# Patient Record
Sex: Male | Born: 1950
Health system: Southern US, Community
[De-identification: ages and names within clinical notes are randomized; demographics above are authoritative.]

## PROBLEM LIST (undated history)

## (undated) DIAGNOSIS — T7840XA Allergy, unspecified, initial encounter: Secondary | ICD-10-CM

## (undated) DIAGNOSIS — I219 Acute myocardial infarction, unspecified: Secondary | ICD-10-CM

## (undated) DIAGNOSIS — E785 Hyperlipidemia, unspecified: Secondary | ICD-10-CM

## (undated) DIAGNOSIS — M1990 Unspecified osteoarthritis, unspecified site: Secondary | ICD-10-CM

## (undated) DIAGNOSIS — I10 Essential (primary) hypertension: Secondary | ICD-10-CM

## (undated) HISTORY — DX: Allergy, unspecified, initial encounter: T78.40XA

## (undated) HISTORY — DX: Hyperlipidemia, unspecified: E78.5

## (undated) HISTORY — DX: Unspecified osteoarthritis, unspecified site: M19.90

---

## 2004-12-10 ENCOUNTER — Emergency Department (HOSPITAL_COMMUNITY): Admission: EM | Admit: 2004-12-10 | Discharge: 2004-12-11 | Payer: Self-pay | Admitting: Emergency Medicine

## 2008-08-05 ENCOUNTER — Emergency Department (HOSPITAL_COMMUNITY): Admission: EM | Admit: 2008-08-05 | Discharge: 2008-08-05 | Payer: Self-pay | Admitting: Family Medicine

## 2008-08-09 ENCOUNTER — Emergency Department (HOSPITAL_COMMUNITY): Admission: EM | Admit: 2008-08-09 | Discharge: 2008-08-09 | Payer: Self-pay | Admitting: Family Medicine

## 2008-10-21 ENCOUNTER — Emergency Department (HOSPITAL_COMMUNITY): Admission: EM | Admit: 2008-10-21 | Discharge: 2008-10-21 | Payer: Self-pay | Admitting: Family Medicine

## 2011-08-01 ENCOUNTER — Emergency Department (HOSPITAL_COMMUNITY)
Admission: EM | Admit: 2011-08-01 | Discharge: 2011-08-01 | Disposition: A | Discharge status: Home / Self Care | Payer: Medicare Other | Attending: Emergency Medicine | Admitting: Emergency Medicine

## 2011-08-01 ENCOUNTER — Encounter (HOSPITAL_COMMUNITY): Payer: Self-pay | Admitting: Emergency Medicine

## 2011-08-01 DIAGNOSIS — J45909 Unspecified asthma, uncomplicated: Secondary | ICD-10-CM | POA: Insufficient documentation

## 2011-08-01 DIAGNOSIS — I10 Essential (primary) hypertension: Secondary | ICD-10-CM | POA: Insufficient documentation

## 2011-08-01 DIAGNOSIS — E119 Type 2 diabetes mellitus without complications: Secondary | ICD-10-CM | POA: Insufficient documentation

## 2011-08-01 DIAGNOSIS — R1084 Generalized abdominal pain: Secondary | ICD-10-CM | POA: Insufficient documentation

## 2011-08-01 DIAGNOSIS — R509 Fever, unspecified: Secondary | ICD-10-CM | POA: Insufficient documentation

## 2011-08-01 HISTORY — DX: Essential (primary) hypertension: I10

## 2011-08-01 LAB — URINALYSIS, ROUTINE W REFLEX MICROSCOPIC
Glucose, UA: NEGATIVE mg/dL
Hgb urine dipstick: NEGATIVE
Ketones, ur: NEGATIVE mg/dL
Leukocytes, UA: NEGATIVE
Nitrite: NEGATIVE
Protein, ur: NEGATIVE mg/dL
Specific Gravity, Urine: 1.027 (ref 1.005–1.030)
Urobilinogen, UA: 1 mg/dL (ref 0.0–1.0)
pH: 6 (ref 5.0–8.0)

## 2011-08-01 LAB — CBC
HCT: 43.7 % (ref 39.0–52.0)
Hemoglobin: 15.2 g/dL (ref 13.0–17.0)
MCH: 30.2 pg (ref 26.0–34.0)
MCHC: 34.8 g/dL (ref 30.0–36.0)
MCV: 86.9 fL (ref 78.0–100.0)
Platelets: 236 10*3/uL (ref 150–400)
RBC: 5.03 MIL/uL (ref 4.22–5.81)
RDW: 13 % (ref 11.5–15.5)
WBC: 12.8 10*3/uL — ABNORMAL HIGH (ref 4.0–10.5)

## 2011-08-01 LAB — DIFFERENTIAL
Basophils Absolute: 0 10*3/uL (ref 0.0–0.1)
Basophils Relative: 0 % (ref 0–1)
Eosinophils Absolute: 0.2 10*3/uL (ref 0.0–0.7)
Eosinophils Relative: 1 % (ref 0–5)
Lymphocytes Relative: 17 % (ref 12–46)
Lymphs Abs: 2.2 10*3/uL (ref 0.7–4.0)
Monocytes Absolute: 1.1 10*3/uL — ABNORMAL HIGH (ref 0.1–1.0)
Monocytes Relative: 8 % (ref 3–12)
Neutro Abs: 9.3 10*3/uL — ABNORMAL HIGH (ref 1.7–7.7)
Neutrophils Relative %: 73 % (ref 43–77)

## 2011-08-01 LAB — COMPREHENSIVE METABOLIC PANEL
ALT: 21 U/L (ref 0–53)
AST: 17 U/L (ref 0–37)
Albumin: 3.7 g/dL (ref 3.5–5.2)
Alkaline Phosphatase: 66 U/L (ref 39–117)
BUN: 14 mg/dL (ref 6–23)
CO2: 25 mEq/L (ref 19–32)
Calcium: 9.1 mg/dL (ref 8.4–10.5)
Chloride: 103 mEq/L (ref 96–112)
Creatinine, Ser: 0.95 mg/dL (ref 0.50–1.35)
GFR calc Af Amer: >90 mL/min (ref >90)
GFR calc non Af Amer: 88 mL/min — ABNORMAL LOW (ref >90)
Glucose, Bld: 61 mg/dL — ABNORMAL LOW (ref 70–99)
Potassium: 3.6 mEq/L (ref 3.5–5.1)
Sodium: 140 mEq/L (ref 135–145)
Total Bilirubin: 0.5 mg/dL (ref 0.3–1.2)
Total Protein: 6.4 g/dL (ref 6.0–8.3)

## 2011-08-01 NOTE — ED Provider Notes (Signed)
History     CSN: [id number]  Arrival date & time 08/01/11  2013   None     Chief Complaint  Patient presents with  . Abdominal Pain    (Consider location/radiation/quality/duration/timing/severity/associated sxs/prior treatment) HPI Comments: Patient has had 2 episodes of crampy, abdominal pain, lasting for 10-15 minutes.  Today's episode was associated with fever.  He reports no nausea, vomiting, constipation, or diarrhea, although he states his bowel movement.  Last night, was a loose within normal he does not have a history of abdominal pain.  Does not check his blood sugars on a regular basis.  Reports he has not had any coughing episodes or difficulty breathing.  Denies chest pain.  Patient is a 61 y.o. male presenting with abdominal pain. The history is provided by the patient.  Abdominal Pain The primary symptoms of the illness include abdominal pain and fever. The primary symptoms of the illness do not include shortness of breath, nausea, vomiting, diarrhea or dysuria. The current episode started yesterday.  Symptoms associated with the illness do not include constipation.    Past Medical History  Diagnosis Date  . Asthma   . Hypertension   . Diabetes mellitus     History reviewed. No pertinent past surgical history.  History reviewed. No pertinent family history.  History  Substance Use Topics  . Smoking status: Never Smoker   . Smokeless tobacco: Not on file  . Alcohol Use: No      Review of Systems  Constitutional: Positive for fever.  Respiratory: Negative for cough and shortness of breath.   Cardiovascular: Negative for chest pain and leg swelling.  Gastrointestinal: Positive for abdominal pain. Negative for nausea, vomiting, diarrhea and constipation.  Genitourinary: Negative for dysuria.  Skin: Negative for color change.  Neurological: Negative for dizziness, weakness and headaches.    Allergies  Penicillins; Percocet; and Sulfa antibiotics  Home  Medications   Current Outpatient Rx  Name Route Sig Dispense Refill  . ACETAMINOPHEN 500 MG PO TABS Oral Take 500 mg by mouth every 6 (six) hours as needed. For pain    . ALBUTEROL SULFATE HFA 108 (90 BASE) MCG/ACT IN AERS Inhalation Inhale 2 puffs into the lungs every 6 (six) hours as needed. For shortness of breath    . GLIPIZIDE ER 5 MG PO TB24 Oral Take 5 mg by mouth daily.    Marland Kitchen VERAPAMIL HCL ER 240 MG PO TBCR Oral Take 240 mg by mouth 2 (two) times daily.      BP 112/63  Pulse 63  Temp(Src) 98.3 F (36.8 C) (Oral)  Resp 18  SpO2 96%  Physical Exam  Constitutional: He is oriented to person, place, and time. He appears well-developed and well-nourished.  HENT:  Head: Normocephalic.  Neck: Normal range of motion.  Cardiovascular: Normal rate.   Pulmonary/Chest: Effort normal.  Abdominal: Soft. Bowel sounds are normal. He exhibits no distension. There is no tenderness.  Musculoskeletal: Normal range of motion.  Neurological: He is alert and oriented to person, place, and time.  Skin: Skin is warm and dry. No rash noted.    ED Course  Procedures (including critical care time)  Labs Reviewed  CBC - Abnormal; Notable for the following:    WBC 12.8 (*)    All other components within normal limits  DIFFERENTIAL - Abnormal; Notable for the following:    Neutro Abs 9.3 (*)    Monocytes Absolute 1.1 (*)    All other components within normal limits  COMPREHENSIVE  METABOLIC PANEL - Abnormal; Notable for the following:    Glucose, Bld 61 (*)    GFR calc non Af Amer 88 (*)    All other components within normal limits  URINALYSIS, ROUTINE W REFLEX MICROSCOPIC - Abnormal; Notable for the following:    Bilirubin Urine SMALL (*)    All other components within normal limits   No results found.   1. Abdominal pain, chronic, generalized     Discussed  with patient and his wife results.  The patient has been pain free since arrival in the emergency Department anything at this  point, it is okay for him to go home.  Monitoring his condition.  If he develops fever over 101.5 persistently nausea, vomiting, diarrhea, change in his symptoms at all.  He is to return for further evaluation  MDM  Exam is nonspecific, but do to his symptoms.  Age and underlying medical condition.  Will evaluate for gallbladder disease.  Signs of infection, UTI        Arman Filter, NP 08/01/11 [id number]

## 2011-08-01 NOTE — ED Notes (Signed)
Pt st's generalized abd pain started last pm after drinking a coke.  St's pain was a cramping type pain.  Denies any nausea or vomiting.  Pt st's he did have elevated temp earlier.

## 2011-08-01 NOTE — Discharge Instructions (Signed)
Abdominal Pain (Nonspecific) Your exam might not show the exact reason you have abdominal pain. Since there are many different causes of abdominal pain, another checkup and more tests may be needed. It is very important to follow up for lasting (persistent) or worsening symptoms. A possible cause of abdominal pain in any person who still has his or her appendix is acute appendicitis. Appendicitis is often hard to diagnose. Normal blood tests, urine tests, ultrasound, and CT scans do not completely rule out early appendicitis or other causes of abdominal pain. Sometimes, only the changes that happen over time will allow appendicitis and other causes of abdominal pain to be determined. Other potential problems that may require surgery may also take time to become more apparent. Because of this, it is important that you follow all of the instructions below. HOME CARE INSTRUCTIONS   Rest as much as possible.   Do not eat solid food until your pain is gone.   While adults or children have pain: A diet of water, weak decaffeinated tea, broth or bouillon, gelatin, oral rehydration solutions (ORS), frozen ice pops, or ice chips may be helpful.   When pain is gone in adults or children: Start a light diet (dry toast, crackers, applesauce, or white rice). Increase the diet slowly as long as it does not bother you. Eat no dairy products (including cheese and eggs) and no spicy, fatty, fried, or high-fiber foods.   Use no alcohol, caffeine, or cigarettes.   Take your regular medicines unless your caregiver told you not to.   Take any prescribed medicine as directed.   Only take over-the-counter or prescription medicines for pain, discomfort, or fever as directed by your caregiver. Do not give aspirin to children.  If your caregiver has given you a follow-up appointment, it is very important to keep that appointment. Not keeping the appointment could result in a permanent injury and/or lasting (chronic) pain  and/or disability. If there is any problem keeping the appointment, you must call to reschedule.  SEEK IMMEDIATE MEDICAL CARE IF:   Your pain is not gone in 24 hours.   Your pain becomes worse, changes location, or feels different.   You or your child has an oral temperature above 102 F (38.9 C), not controlled by medicine.   Your baby is older than 3 months with a rectal temperature of 102 F (38.9 C) or higher.   Your baby is 21 months old or younger with a rectal temperature of 100.4 F (38 C) or higher.   You have shaking chills.   You keep throwing up (vomiting) or cannot drink liquids.   There is blood in your vomit or you see blood in your bowel movements.   Your bowel movements become dark or black.   You have frequent bowel movements.   Your bowel movements stop (become blocked) or you cannot pass gas.   You have bloody, frequent, or painful urination.   You have yellow discoloration in the skin or whites of the eyes.   Your stomach becomes bloated or bigger.   You have dizziness or fainting.   You have chest or back pain.  MAKE SURE YOU:   Understand these instructions.   Will watch your condition.   Will get help right away if you are not doing well or get worse.  Document Released: 02/25/2005 Document Revised: 02/14/2011 Document Reviewed: 01/23/2009 [facility] Patient Information 2012 [place], [place]. As discussed.  If your symptoms become more persistent or change in any  way, you develop fever, nausea, vomiting, diarrhea.  Please return for further evaluation

## 2011-08-01 NOTE — ED Notes (Signed)
Patient with abdominal pain that started last night, no nausea or vomiting.

## 2011-08-02 NOTE — ED Provider Notes (Signed)
Medical screening examination/treatment/procedure(s) were performed by non-physician practitioner and as supervising physician I was immediately available for consultation/collaboration.  Flint Melter, MD 08/02/11 [phone]

## 2011-09-14 ENCOUNTER — Emergency Department (INDEPENDENT_AMBULATORY_CARE_PROVIDER_SITE_OTHER): Payer: Medicare Other

## 2011-09-14 ENCOUNTER — Encounter (HOSPITAL_COMMUNITY): Payer: Self-pay | Admitting: *Deleted

## 2011-09-14 ENCOUNTER — Emergency Department (INDEPENDENT_AMBULATORY_CARE_PROVIDER_SITE_OTHER)
Admission: EM | Admit: 2011-09-14 | Discharge: 2011-09-14 | Disposition: A | Discharge status: Home / Self Care | Payer: Medicare Other | Source: Home / Self Care | Attending: Emergency Medicine | Admitting: Emergency Medicine

## 2011-09-14 DIAGNOSIS — J45909 Unspecified asthma, uncomplicated: Secondary | ICD-10-CM

## 2011-09-14 DIAGNOSIS — J209 Acute bronchitis, unspecified: Secondary | ICD-10-CM

## 2011-09-14 HISTORY — DX: Acute myocardial infarction, unspecified: I21.9

## 2011-09-14 MED ORDER — BENZONATATE 200 MG PO CAPS: 200.0000 mg | ORAL_CAPSULE | Freq: Three times a day (TID) | ORAL | Status: AC | PRN

## 2011-09-14 MED ORDER — AZITHROMYCIN 250 MG PO TABS: ORAL_TABLET | ORAL | Status: AC

## 2011-09-14 MED ORDER — PREDNISONE 5 MG PO KIT: 1.0000 | PACK | Freq: Every day | ORAL | Status: DC

## 2011-09-14 NOTE — ED Provider Notes (Signed)
Chief Complaint  Patient presents with  . Cough  . Fever  . Nasal Congestion  . Wheezing  . Lymphadenopathy    History of Present Illness:   Anthony Swanson is a 61 year old male who has had a three-day history of nasal congestion with clear rhinorrhea, dry cough, temperature of up to 102, headache, and wheezing. He has a history of mild, intermittent asthma and takes albuterol as needed. He denies any headache, sinus pressure, ear pain, chest pain, shortness of breath, nausea, vomiting, or diarrhea.  Review of Systems:  Other than noted above, the patient denies any of the following symptoms. Systemic:  No fever, chills, sweats, fatigue, myalgias, headache, or anorexia. Eye:  No redness, pain or drainage. ENT:  No earache, ear congestion, nasal congestion, sneezing, rhinorrhea, sinus pressure, sinus pain, post nasal drip, or sore throat. Lungs:  No cough, sputum production, wheezing, shortness of breath, or chest pain. GI:  No abdominal pain, nausea, vomiting, or diarrhea. Skin:  No rash or itching.  PMFSH:  Past medical history, family history, social history, meds, and allergies were reviewed.  Physical Exam:   Vital signs:  BP 131/82  Pulse 64  Temp 98.7 F (37.1 C) (Oral)  Resp 16  SpO2 97% General:  Alert, in no distress. Eye:  No conjunctival injection or drainage. Lids were normal. ENT:  TMs and canals were normal, without erythema or inflammation.  Nasal mucosa was clear and uncongested, without drainage.  Mucous membranes were moist.  Pharynx was clear, without exudate or drainage.  There were no oral ulcerations or lesions. Neck:  Supple, no adenopathy, tenderness or mass. Lungs:  No respiratory distress.  Breath sounds are distant but quiet. He did have a few rales at the right base. No wheezes or rhonchi.  Heart:  Regular rhythm, without gallops, murmers or rubs. Skin:  Clear, warm, and dry, without rash or lesions.  Radiology:  Dg Chest 2 View  09/14/2011  *RADIOLOGY REPORT*   Clinical Data: 3-day history of cough and fever.  CHEST - 2 VIEW  Comparison: Two-view chest x-ray 08/09/2008.  Findings: Cardiac silhouette mildly enlarged but stable.  Thoracic aorta mildly tortuous and atherosclerotic, unchanged.  Hilar and mediastinal contours otherwise unremarkable.  Minimal linear scarring in the lingula.  Lungs otherwise clear.  No pleural effusions.  Pleural thickening laterally involving both sides of the chest likely due to subpleural fat.  Visualized bony thorax intact.  IMPRESSION: Minimal scarring in the lingula.  Stable cardiomegaly.  No acute cardiopulmonary disease.  Original Report Authenticated By: Arnell Sieving, M.D.    Assessment:  The primary encounter diagnosis was Acute bronchitis. A diagnosis of Asthma was also pertinent to this visit.  Plan:   1.  The following meds were prescribed:   New Prescriptions   AZITHROMYCIN (ZITHROMAX Z-PAK) 250 MG TABLET    Take as directed.   BENZONATATE (TESSALON) 200 MG CAPSULE    Take 1 capsule (200 mg total) by mouth 3 (three) times daily as needed for cough.   PREDNISONE 5 MG KIT    Take 1 kit (5 mg total) by mouth daily after breakfast. Prednisone 5 mg 6 day dosepack.  Take as directed.   2.  The patient was instructed in symptomatic care and handouts were given. 3.  The patient was told to return if becoming worse in any way, if no better in 3 or 4 days, and given some red flag symptoms that would indicate earlier return. He was instructed to use his albuterol  inhaler regularly.   Reuben Likes, MD 09/14/11 [phone]

## 2011-09-14 NOTE — ED Notes (Signed)
Pt with onset of cough /congestion/sneezing/fever Thursday - last used inhaler 3am this morning - fever 3am 101.5 -  Lungs diminished

## 2013-02-10 IMAGING — CR DG CHEST 2V
2 series · 2 of 2 positions shown · non-contrast
Comparison: Two-view chest x-ray 08/09/2008.

CLINICAL DATA: 3-day history of cough and fever.

CHEST - 2 VIEW

[view not recorded (1 of 2)]
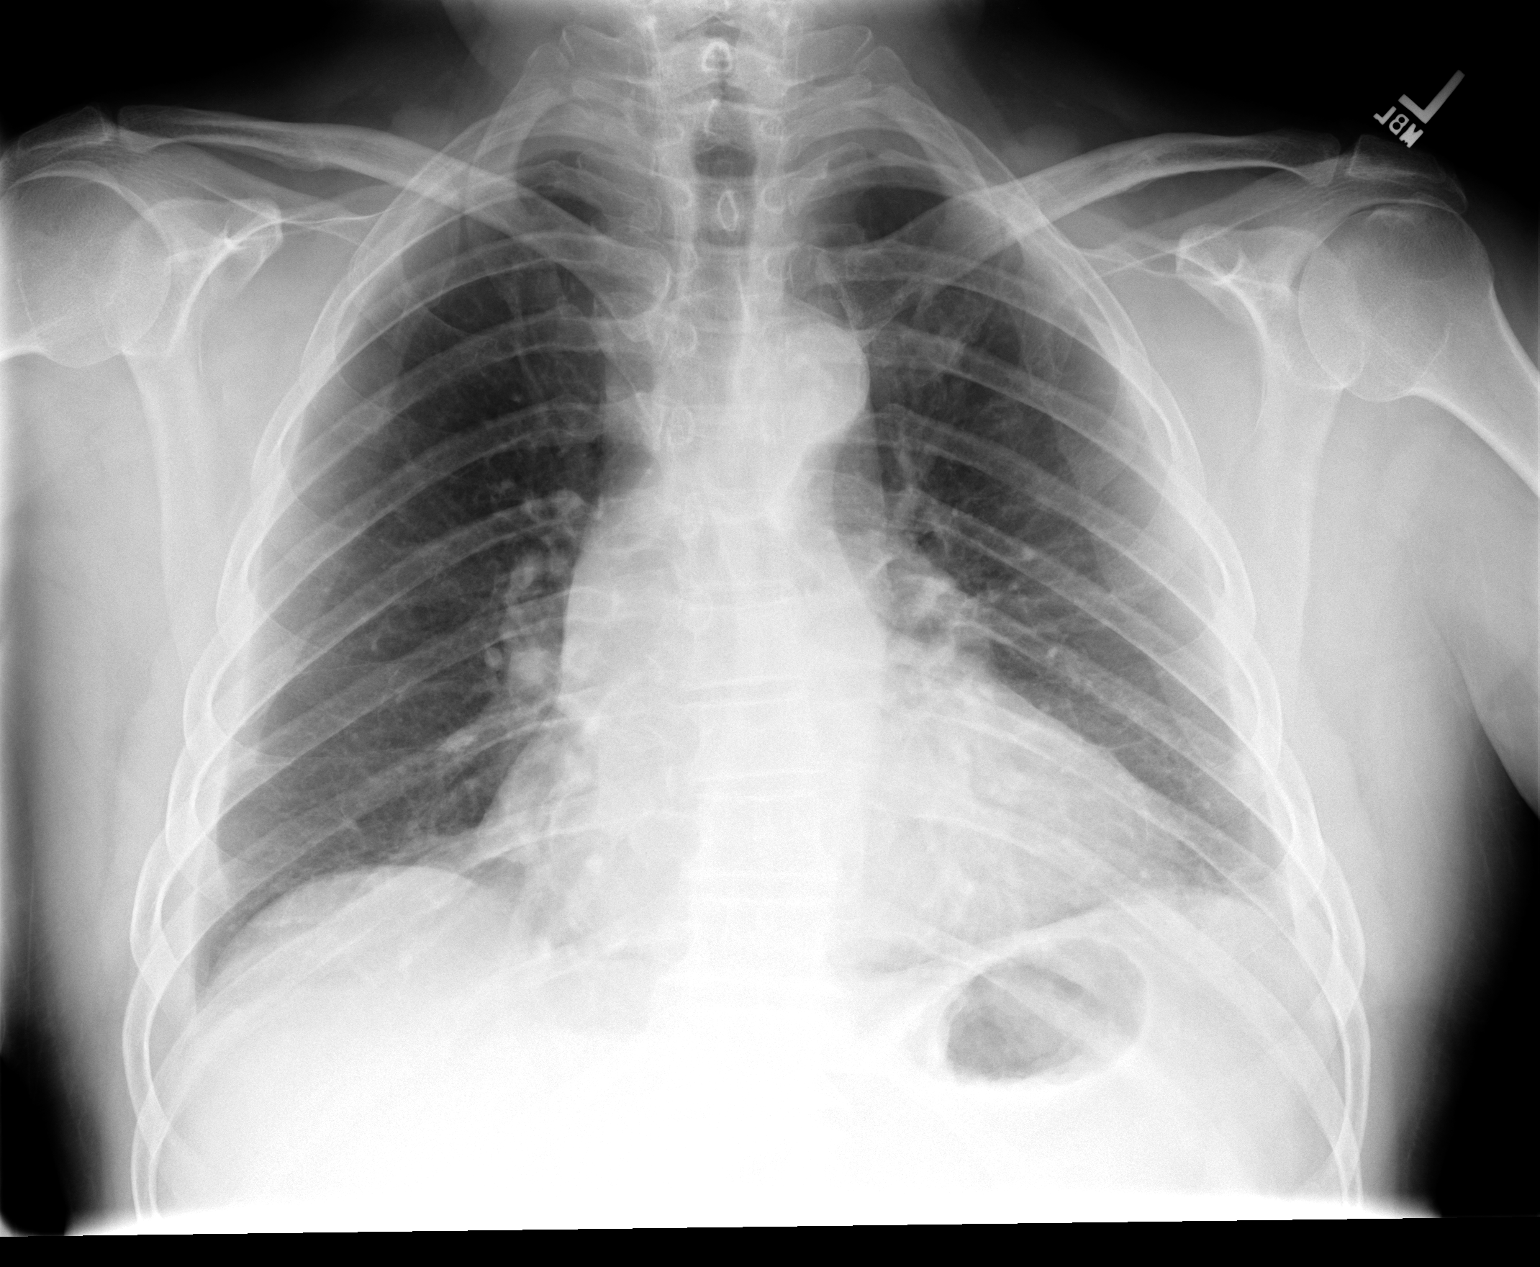

[view not recorded (2 of 2)]
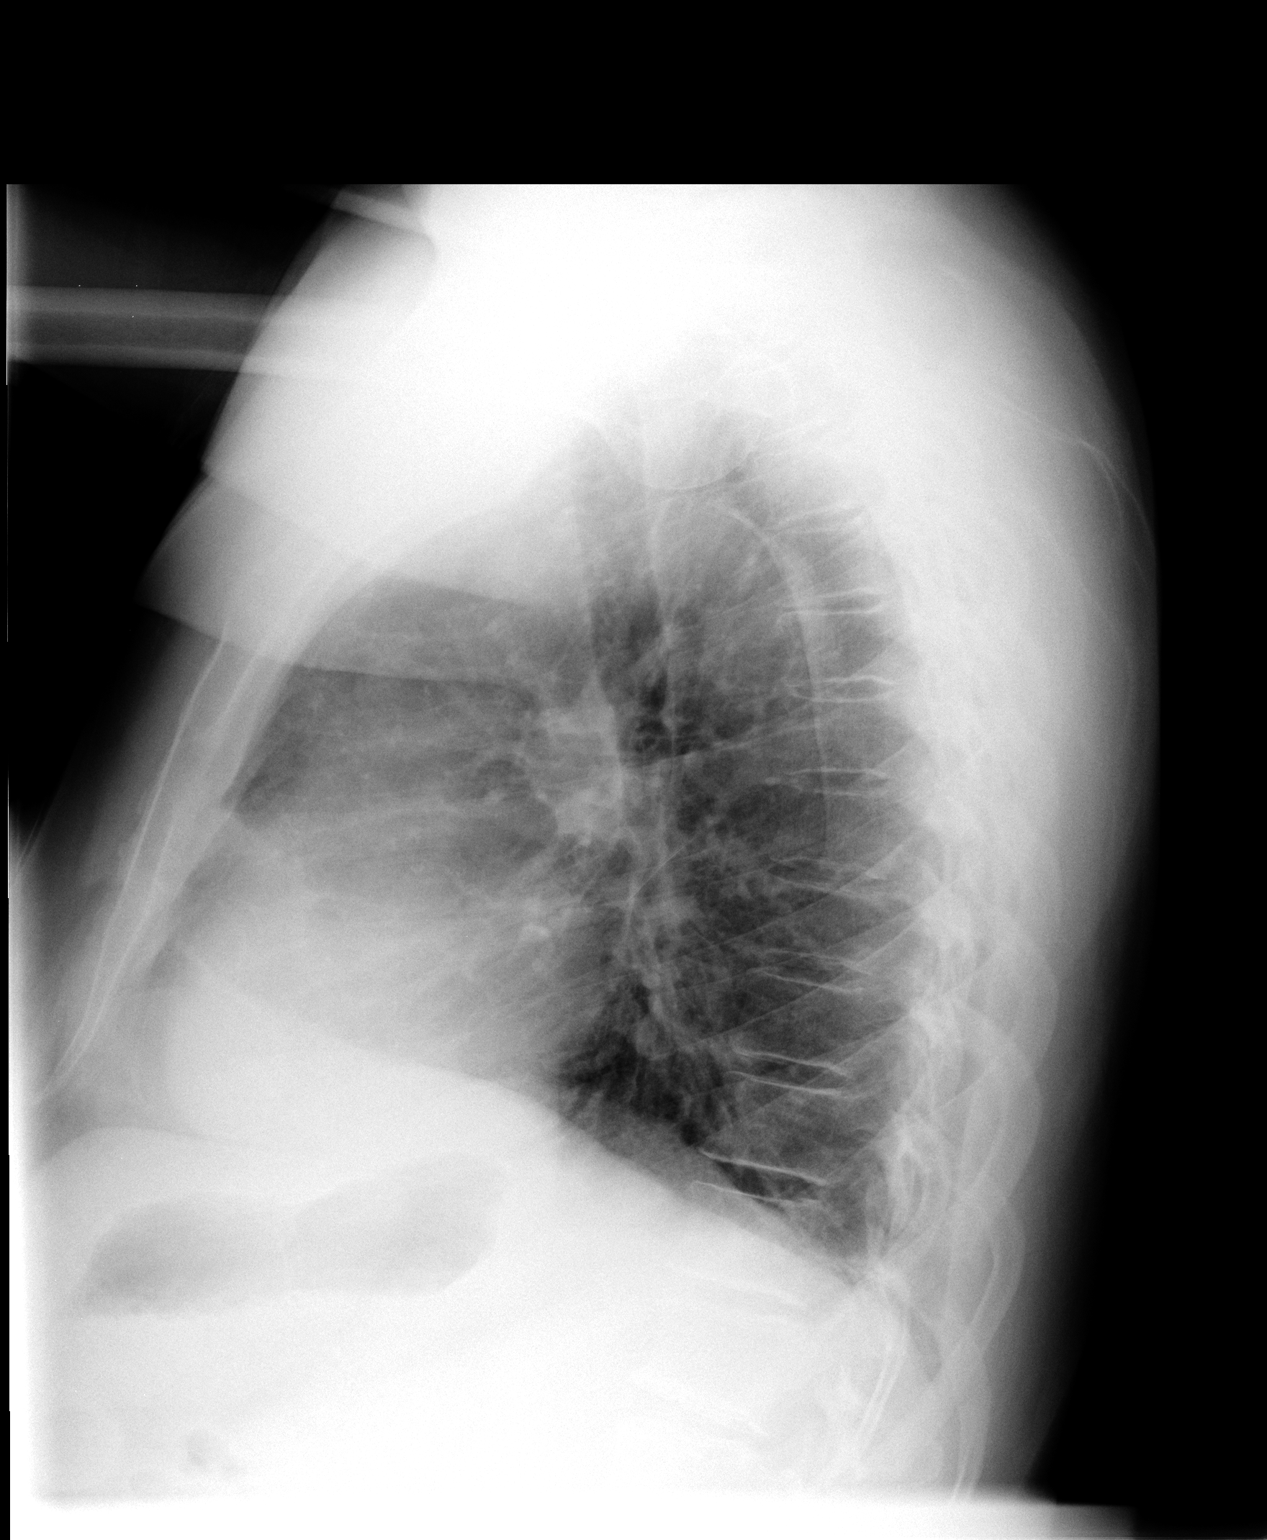

[2 of 2 positions shown; findings below may reference images not displayed]

FINDINGS: Cardiac silhouette mildly enlarged but stable.  Thoracic
aorta mildly tortuous and atherosclerotic, unchanged.  Hilar and
mediastinal contours otherwise unremarkable.  Minimal linear
scarring in the lingula.  Lungs otherwise clear.  No pleural
effusions.  Pleural thickening laterally involving both sides of
the chest likely due to subpleural fat.  Visualized bony thorax
intact.
IMPRESSION: Minimal scarring in the lingula.  Stable cardiomegaly.  No acute
cardiopulmonary disease.

## 2015-04-20 DIAGNOSIS — J4520 Mild intermittent asthma, uncomplicated: Secondary | ICD-10-CM | POA: Diagnosis not present

## 2015-06-02 DIAGNOSIS — Z0000 Encounter for general adult medical examination without abnormal findings: Secondary | ICD-10-CM | POA: Diagnosis not present

## 2015-06-02 DIAGNOSIS — I10 Essential (primary) hypertension: Secondary | ICD-10-CM | POA: Diagnosis not present

## 2015-06-02 DIAGNOSIS — E1165 Type 2 diabetes mellitus with hyperglycemia: Secondary | ICD-10-CM | POA: Diagnosis not present

## 2015-06-14 DIAGNOSIS — E1165 Type 2 diabetes mellitus with hyperglycemia: Secondary | ICD-10-CM | POA: Diagnosis not present

## 2015-06-14 DIAGNOSIS — I10 Essential (primary) hypertension: Secondary | ICD-10-CM | POA: Diagnosis not present

## 2015-09-18 DIAGNOSIS — E1165 Type 2 diabetes mellitus with hyperglycemia: Secondary | ICD-10-CM | POA: Diagnosis not present

## 2015-09-18 DIAGNOSIS — K210 Gastro-esophageal reflux disease with esophagitis: Secondary | ICD-10-CM | POA: Diagnosis not present

## 2015-09-18 DIAGNOSIS — I10 Essential (primary) hypertension: Secondary | ICD-10-CM | POA: Diagnosis not present

## 2015-12-20 DIAGNOSIS — J4520 Mild intermittent asthma, uncomplicated: Secondary | ICD-10-CM | POA: Diagnosis not present

## 2016-02-05 DIAGNOSIS — E1165 Type 2 diabetes mellitus with hyperglycemia: Secondary | ICD-10-CM | POA: Diagnosis not present

## 2016-02-05 DIAGNOSIS — K210 Gastro-esophageal reflux disease with esophagitis: Secondary | ICD-10-CM | POA: Diagnosis not present

## 2016-02-05 DIAGNOSIS — I10 Essential (primary) hypertension: Secondary | ICD-10-CM | POA: Diagnosis not present

## 2016-02-05 DIAGNOSIS — Z1389 Encounter for screening for other disorder: Secondary | ICD-10-CM | POA: Diagnosis not present

## 2016-02-05 DIAGNOSIS — Z0000 Encounter for general adult medical examination without abnormal findings: Secondary | ICD-10-CM | POA: Diagnosis not present

## 2016-04-18 DIAGNOSIS — E1165 Type 2 diabetes mellitus with hyperglycemia: Secondary | ICD-10-CM | POA: Diagnosis not present

## 2016-04-18 DIAGNOSIS — I10 Essential (primary) hypertension: Secondary | ICD-10-CM | POA: Diagnosis not present

## 2016-04-18 DIAGNOSIS — K210 Gastro-esophageal reflux disease with esophagitis: Secondary | ICD-10-CM | POA: Diagnosis not present

## 2016-04-18 DIAGNOSIS — Z0000 Encounter for general adult medical examination without abnormal findings: Secondary | ICD-10-CM | POA: Diagnosis not present

## 2016-07-18 DIAGNOSIS — I10 Essential (primary) hypertension: Secondary | ICD-10-CM | POA: Diagnosis not present

## 2016-07-18 DIAGNOSIS — K210 Gastro-esophageal reflux disease with esophagitis: Secondary | ICD-10-CM | POA: Diagnosis not present

## 2016-07-18 DIAGNOSIS — E1165 Type 2 diabetes mellitus with hyperglycemia: Secondary | ICD-10-CM | POA: Diagnosis not present

## 2016-10-25 DIAGNOSIS — K210 Gastro-esophageal reflux disease with esophagitis: Secondary | ICD-10-CM | POA: Diagnosis not present

## 2016-10-25 DIAGNOSIS — I10 Essential (primary) hypertension: Secondary | ICD-10-CM | POA: Diagnosis not present

## 2016-10-25 DIAGNOSIS — E1165 Type 2 diabetes mellitus with hyperglycemia: Secondary | ICD-10-CM | POA: Diagnosis not present

## 2016-10-30 DIAGNOSIS — E1165 Type 2 diabetes mellitus with hyperglycemia: Secondary | ICD-10-CM | POA: Diagnosis not present

## 2016-10-30 DIAGNOSIS — I10 Essential (primary) hypertension: Secondary | ICD-10-CM | POA: Diagnosis not present

## 2017-01-28 DIAGNOSIS — I10 Essential (primary) hypertension: Secondary | ICD-10-CM | POA: Diagnosis not present

## 2017-01-28 DIAGNOSIS — E1165 Type 2 diabetes mellitus with hyperglycemia: Secondary | ICD-10-CM | POA: Diagnosis not present

## 2017-01-28 DIAGNOSIS — K210 Gastro-esophageal reflux disease with esophagitis: Secondary | ICD-10-CM | POA: Diagnosis not present

## 2017-05-22 DIAGNOSIS — K210 Gastro-esophageal reflux disease with esophagitis: Secondary | ICD-10-CM | POA: Diagnosis not present

## 2017-05-22 DIAGNOSIS — E1165 Type 2 diabetes mellitus with hyperglycemia: Secondary | ICD-10-CM | POA: Diagnosis not present

## 2017-05-22 DIAGNOSIS — I10 Essential (primary) hypertension: Secondary | ICD-10-CM | POA: Diagnosis not present

## 2017-05-22 DIAGNOSIS — Z1389 Encounter for screening for other disorder: Secondary | ICD-10-CM | POA: Diagnosis not present

## 2017-05-22 DIAGNOSIS — Z0000 Encounter for general adult medical examination without abnormal findings: Secondary | ICD-10-CM | POA: Diagnosis not present

## 2017-06-19 DIAGNOSIS — J4520 Mild intermittent asthma, uncomplicated: Secondary | ICD-10-CM | POA: Diagnosis not present

## 2017-07-19 DIAGNOSIS — J4520 Mild intermittent asthma, uncomplicated: Secondary | ICD-10-CM | POA: Diagnosis not present

## 2017-08-19 DIAGNOSIS — J4520 Mild intermittent asthma, uncomplicated: Secondary | ICD-10-CM | POA: Diagnosis not present

## 2017-08-21 DIAGNOSIS — K210 Gastro-esophageal reflux disease with esophagitis: Secondary | ICD-10-CM | POA: Diagnosis not present

## 2017-08-21 DIAGNOSIS — E1165 Type 2 diabetes mellitus with hyperglycemia: Secondary | ICD-10-CM | POA: Diagnosis not present

## 2017-08-21 DIAGNOSIS — I10 Essential (primary) hypertension: Secondary | ICD-10-CM | POA: Diagnosis not present

## 2017-09-18 DIAGNOSIS — J4520 Mild intermittent asthma, uncomplicated: Secondary | ICD-10-CM | POA: Diagnosis not present

## 2017-10-05 DIAGNOSIS — K3580 Unspecified acute appendicitis: Secondary | ICD-10-CM | POA: Diagnosis not present

## 2017-10-05 DIAGNOSIS — R109 Unspecified abdominal pain: Secondary | ICD-10-CM | POA: Diagnosis not present

## 2017-10-05 DIAGNOSIS — Z880 Allergy status to penicillin: Secondary | ICD-10-CM | POA: Diagnosis not present

## 2017-10-05 DIAGNOSIS — Z91030 Bee allergy status: Secondary | ICD-10-CM | POA: Diagnosis not present

## 2017-10-05 DIAGNOSIS — R1031 Right lower quadrant pain: Secondary | ICD-10-CM | POA: Diagnosis not present

## 2017-10-05 DIAGNOSIS — Z882 Allergy status to sulfonamides status: Secondary | ICD-10-CM | POA: Diagnosis not present

## 2017-10-05 DIAGNOSIS — Z7984 Long term (current) use of oral hypoglycemic drugs: Secondary | ICD-10-CM | POA: Diagnosis not present

## 2017-10-05 DIAGNOSIS — Z885 Allergy status to narcotic agent status: Secondary | ICD-10-CM | POA: Diagnosis not present

## 2017-10-05 DIAGNOSIS — E119 Type 2 diabetes mellitus without complications: Secondary | ICD-10-CM | POA: Diagnosis not present

## 2017-10-05 DIAGNOSIS — Z79899 Other long term (current) drug therapy: Secondary | ICD-10-CM | POA: Diagnosis not present

## 2017-10-05 DIAGNOSIS — I10 Essential (primary) hypertension: Secondary | ICD-10-CM | POA: Diagnosis not present

## 2017-10-06 DIAGNOSIS — Z880 Allergy status to penicillin: Secondary | ICD-10-CM | POA: Diagnosis not present

## 2017-10-06 DIAGNOSIS — Z7984 Long term (current) use of oral hypoglycemic drugs: Secondary | ICD-10-CM | POA: Diagnosis not present

## 2017-10-06 DIAGNOSIS — I10 Essential (primary) hypertension: Secondary | ICD-10-CM | POA: Diagnosis not present

## 2017-10-06 DIAGNOSIS — Z885 Allergy status to narcotic agent status: Secondary | ICD-10-CM | POA: Diagnosis not present

## 2017-10-06 DIAGNOSIS — K3580 Unspecified acute appendicitis: Secondary | ICD-10-CM | POA: Diagnosis not present

## 2017-10-06 DIAGNOSIS — Z79899 Other long term (current) drug therapy: Secondary | ICD-10-CM | POA: Diagnosis not present

## 2017-10-06 DIAGNOSIS — K4090 Unilateral inguinal hernia, without obstruction or gangrene, not specified as recurrent: Secondary | ICD-10-CM | POA: Diagnosis not present

## 2017-10-06 DIAGNOSIS — Z91030 Bee allergy status: Secondary | ICD-10-CM | POA: Diagnosis not present

## 2017-10-06 DIAGNOSIS — R1031 Right lower quadrant pain: Secondary | ICD-10-CM | POA: Diagnosis not present

## 2017-10-06 DIAGNOSIS — E119 Type 2 diabetes mellitus without complications: Secondary | ICD-10-CM | POA: Diagnosis not present

## 2017-10-06 DIAGNOSIS — Z882 Allergy status to sulfonamides status: Secondary | ICD-10-CM | POA: Diagnosis not present

## 2017-10-07 DIAGNOSIS — E119 Type 2 diabetes mellitus without complications: Secondary | ICD-10-CM | POA: Diagnosis not present

## 2017-10-07 DIAGNOSIS — K3580 Unspecified acute appendicitis: Secondary | ICD-10-CM | POA: Diagnosis not present

## 2017-10-07 DIAGNOSIS — I10 Essential (primary) hypertension: Secondary | ICD-10-CM | POA: Diagnosis not present

## 2017-10-07 DIAGNOSIS — Z880 Allergy status to penicillin: Secondary | ICD-10-CM | POA: Diagnosis not present

## 2017-10-07 DIAGNOSIS — Z79899 Other long term (current) drug therapy: Secondary | ICD-10-CM | POA: Diagnosis not present

## 2017-10-07 DIAGNOSIS — Z7984 Long term (current) use of oral hypoglycemic drugs: Secondary | ICD-10-CM | POA: Diagnosis not present

## 2017-10-07 DIAGNOSIS — Z885 Allergy status to narcotic agent status: Secondary | ICD-10-CM | POA: Diagnosis not present

## 2017-10-07 DIAGNOSIS — Z91030 Bee allergy status: Secondary | ICD-10-CM | POA: Diagnosis not present

## 2017-10-07 DIAGNOSIS — Z882 Allergy status to sulfonamides status: Secondary | ICD-10-CM | POA: Diagnosis not present

## 2017-10-08 DIAGNOSIS — K3580 Unspecified acute appendicitis: Secondary | ICD-10-CM | POA: Diagnosis not present

## 2017-10-08 DIAGNOSIS — Z885 Allergy status to narcotic agent status: Secondary | ICD-10-CM | POA: Diagnosis not present

## 2017-10-08 DIAGNOSIS — Z880 Allergy status to penicillin: Secondary | ICD-10-CM | POA: Diagnosis not present

## 2017-10-08 DIAGNOSIS — Z7984 Long term (current) use of oral hypoglycemic drugs: Secondary | ICD-10-CM | POA: Diagnosis not present

## 2017-10-08 DIAGNOSIS — Z79899 Other long term (current) drug therapy: Secondary | ICD-10-CM | POA: Diagnosis not present

## 2017-10-08 DIAGNOSIS — Z91030 Bee allergy status: Secondary | ICD-10-CM | POA: Diagnosis not present

## 2017-10-08 DIAGNOSIS — Z882 Allergy status to sulfonamides status: Secondary | ICD-10-CM | POA: Diagnosis not present

## 2017-10-08 DIAGNOSIS — E119 Type 2 diabetes mellitus without complications: Secondary | ICD-10-CM | POA: Diagnosis not present

## 2017-10-08 DIAGNOSIS — I10 Essential (primary) hypertension: Secondary | ICD-10-CM | POA: Diagnosis not present

## 2017-10-13 DIAGNOSIS — E1165 Type 2 diabetes mellitus with hyperglycemia: Secondary | ICD-10-CM | POA: Diagnosis not present

## 2017-10-13 DIAGNOSIS — I10 Essential (primary) hypertension: Secondary | ICD-10-CM | POA: Diagnosis not present

## 2017-10-13 DIAGNOSIS — K210 Gastro-esophageal reflux disease with esophagitis: Secondary | ICD-10-CM | POA: Diagnosis not present

## 2017-10-19 DIAGNOSIS — J4520 Mild intermittent asthma, uncomplicated: Secondary | ICD-10-CM | POA: Diagnosis not present

## 2017-11-19 DIAGNOSIS — J4520 Mild intermittent asthma, uncomplicated: Secondary | ICD-10-CM | POA: Diagnosis not present

## 2017-11-27 DIAGNOSIS — I10 Essential (primary) hypertension: Secondary | ICD-10-CM | POA: Diagnosis not present

## 2017-11-27 DIAGNOSIS — Z0000 Encounter for general adult medical examination without abnormal findings: Secondary | ICD-10-CM | POA: Diagnosis not present

## 2017-11-27 DIAGNOSIS — K210 Gastro-esophageal reflux disease with esophagitis: Secondary | ICD-10-CM | POA: Diagnosis not present

## 2017-11-27 DIAGNOSIS — E1165 Type 2 diabetes mellitus with hyperglycemia: Secondary | ICD-10-CM | POA: Diagnosis not present

## 2018-01-19 DIAGNOSIS — J4520 Mild intermittent asthma, uncomplicated: Secondary | ICD-10-CM | POA: Diagnosis not present

## 2018-02-18 DIAGNOSIS — J4520 Mild intermittent asthma, uncomplicated: Secondary | ICD-10-CM | POA: Diagnosis not present

## 2018-03-02 DIAGNOSIS — K210 Gastro-esophageal reflux disease with esophagitis: Secondary | ICD-10-CM | POA: Diagnosis not present

## 2018-03-02 DIAGNOSIS — E1165 Type 2 diabetes mellitus with hyperglycemia: Secondary | ICD-10-CM | POA: Diagnosis not present

## 2018-03-02 DIAGNOSIS — I10 Essential (primary) hypertension: Secondary | ICD-10-CM | POA: Diagnosis not present

## 2018-03-10 DIAGNOSIS — E1141 Type 2 diabetes mellitus with diabetic mononeuropathy: Secondary | ICD-10-CM | POA: Diagnosis not present

## 2018-03-11 HISTORY — PX: APPENDECTOMY: SHX54

## 2018-03-21 DIAGNOSIS — J4520 Mild intermittent asthma, uncomplicated: Secondary | ICD-10-CM | POA: Diagnosis not present

## 2018-04-21 DIAGNOSIS — J4520 Mild intermittent asthma, uncomplicated: Secondary | ICD-10-CM | POA: Diagnosis not present

## 2018-05-20 DIAGNOSIS — J4520 Mild intermittent asthma, uncomplicated: Secondary | ICD-10-CM | POA: Diagnosis not present

## 2018-06-04 DIAGNOSIS — Z1389 Encounter for screening for other disorder: Secondary | ICD-10-CM | POA: Diagnosis not present

## 2018-06-04 DIAGNOSIS — K210 Gastro-esophageal reflux disease with esophagitis: Secondary | ICD-10-CM | POA: Diagnosis not present

## 2018-06-04 DIAGNOSIS — I10 Essential (primary) hypertension: Secondary | ICD-10-CM | POA: Diagnosis not present

## 2018-06-04 DIAGNOSIS — Z0000 Encounter for general adult medical examination without abnormal findings: Secondary | ICD-10-CM | POA: Diagnosis not present

## 2018-06-04 DIAGNOSIS — E1141 Type 2 diabetes mellitus with diabetic mononeuropathy: Secondary | ICD-10-CM | POA: Diagnosis not present

## 2018-10-08 DIAGNOSIS — K210 Gastro-esophageal reflux disease with esophagitis: Secondary | ICD-10-CM | POA: Diagnosis not present

## 2018-10-08 DIAGNOSIS — I10 Essential (primary) hypertension: Secondary | ICD-10-CM | POA: Diagnosis not present

## 2018-10-08 DIAGNOSIS — E785 Hyperlipidemia, unspecified: Secondary | ICD-10-CM | POA: Diagnosis not present

## 2018-10-08 DIAGNOSIS — E1141 Type 2 diabetes mellitus with diabetic mononeuropathy: Secondary | ICD-10-CM | POA: Diagnosis not present

## 2019-01-19 DIAGNOSIS — I10 Essential (primary) hypertension: Secondary | ICD-10-CM | POA: Diagnosis not present

## 2019-01-19 DIAGNOSIS — Z6831 Body mass index (BMI) 31.0-31.9, adult: Secondary | ICD-10-CM | POA: Diagnosis not present

## 2019-01-19 DIAGNOSIS — K219 Gastro-esophageal reflux disease without esophagitis: Secondary | ICD-10-CM | POA: Diagnosis not present

## 2019-01-19 DIAGNOSIS — E7849 Other hyperlipidemia: Secondary | ICD-10-CM | POA: Diagnosis not present

## 2019-01-19 DIAGNOSIS — E1141 Type 2 diabetes mellitus with diabetic mononeuropathy: Secondary | ICD-10-CM | POA: Diagnosis not present

## 2019-04-22 DIAGNOSIS — E7849 Other hyperlipidemia: Secondary | ICD-10-CM | POA: Diagnosis not present

## 2019-04-22 DIAGNOSIS — K2100 Gastro-esophageal reflux disease with esophagitis, without bleeding: Secondary | ICD-10-CM | POA: Diagnosis not present

## 2019-04-22 DIAGNOSIS — E1141 Type 2 diabetes mellitus with diabetic mononeuropathy: Secondary | ICD-10-CM | POA: Diagnosis not present

## 2019-04-22 DIAGNOSIS — I10 Essential (primary) hypertension: Secondary | ICD-10-CM | POA: Diagnosis not present

## 2019-05-21 DIAGNOSIS — H5213 Myopia, bilateral: Secondary | ICD-10-CM | POA: Diagnosis not present

## 2019-07-13 DIAGNOSIS — H52223 Regular astigmatism, bilateral: Secondary | ICD-10-CM | POA: Diagnosis not present

## 2019-07-13 DIAGNOSIS — H524 Presbyopia: Secondary | ICD-10-CM | POA: Diagnosis not present

## 2019-07-26 DIAGNOSIS — I10 Essential (primary) hypertension: Secondary | ICD-10-CM | POA: Diagnosis not present

## 2019-07-26 DIAGNOSIS — Z0000 Encounter for general adult medical examination without abnormal findings: Secondary | ICD-10-CM | POA: Diagnosis not present

## 2019-07-26 DIAGNOSIS — E1141 Type 2 diabetes mellitus with diabetic mononeuropathy: Secondary | ICD-10-CM | POA: Diagnosis not present

## 2019-07-26 DIAGNOSIS — K2100 Gastro-esophageal reflux disease with esophagitis, without bleeding: Secondary | ICD-10-CM | POA: Diagnosis not present

## 2019-07-26 DIAGNOSIS — E7849 Other hyperlipidemia: Secondary | ICD-10-CM | POA: Diagnosis not present

## 2019-08-03 DIAGNOSIS — E119 Type 2 diabetes mellitus without complications: Secondary | ICD-10-CM | POA: Diagnosis not present

## 2019-08-06 DIAGNOSIS — E119 Type 2 diabetes mellitus without complications: Secondary | ICD-10-CM | POA: Diagnosis not present

## 2019-11-25 DIAGNOSIS — I10 Essential (primary) hypertension: Secondary | ICD-10-CM | POA: Diagnosis not present

## 2019-11-25 DIAGNOSIS — E1141 Type 2 diabetes mellitus with diabetic mononeuropathy: Secondary | ICD-10-CM | POA: Diagnosis not present

## 2019-11-25 DIAGNOSIS — E7849 Other hyperlipidemia: Secondary | ICD-10-CM | POA: Diagnosis not present

## 2019-11-25 DIAGNOSIS — K2100 Gastro-esophageal reflux disease with esophagitis, without bleeding: Secondary | ICD-10-CM | POA: Diagnosis not present

## 2019-11-25 DIAGNOSIS — Z0000 Encounter for general adult medical examination without abnormal findings: Secondary | ICD-10-CM | POA: Diagnosis not present

## 2020-01-11 ENCOUNTER — Ambulatory Visit: Payer: Self-pay | Admitting: Urology

## 2020-01-13 ENCOUNTER — Other Ambulatory Visit: Payer: Self-pay

## 2020-02-16 ENCOUNTER — Ambulatory Visit: Payer: Medicare Other | Admitting: Urology

## 2020-04-20 DIAGNOSIS — K2100 Gastro-esophageal reflux disease with esophagitis, without bleeding: Secondary | ICD-10-CM | POA: Diagnosis not present

## 2020-04-20 DIAGNOSIS — E7849 Other hyperlipidemia: Secondary | ICD-10-CM | POA: Diagnosis not present

## 2020-04-20 DIAGNOSIS — I10 Essential (primary) hypertension: Secondary | ICD-10-CM | POA: Diagnosis not present

## 2020-04-20 DIAGNOSIS — E1141 Type 2 diabetes mellitus with diabetic mononeuropathy: Secondary | ICD-10-CM | POA: Diagnosis not present

## 2020-04-20 DIAGNOSIS — Z0000 Encounter for general adult medical examination without abnormal findings: Secondary | ICD-10-CM | POA: Diagnosis not present

## 2020-04-24 ENCOUNTER — Encounter (INDEPENDENT_AMBULATORY_CARE_PROVIDER_SITE_OTHER): Payer: Self-pay | Admitting: *Deleted

## 2020-05-01 ENCOUNTER — Telehealth (INDEPENDENT_AMBULATORY_CARE_PROVIDER_SITE_OTHER): Payer: Self-pay | Admitting: Internal Medicine

## 2020-05-01 NOTE — Telephone Encounter (Signed)
Patient was mailed out a questionnaire - must have received it today - called stated he does not need a colonoscopy

## 2020-05-17 ENCOUNTER — Ambulatory Visit (INDEPENDENT_AMBULATORY_CARE_PROVIDER_SITE_OTHER): Payer: Medicare Other | Admitting: Family Medicine

## 2020-05-17 ENCOUNTER — Encounter: Payer: Self-pay | Admitting: Family Medicine

## 2020-05-17 ENCOUNTER — Other Ambulatory Visit: Payer: Self-pay

## 2020-05-17 VITALS — BP 135/76 | HR 79 | Temp 97.9°F | Ht 66.0 in | Wt 184.4 lb

## 2020-05-17 DIAGNOSIS — I219 Acute myocardial infarction, unspecified: Secondary | ICD-10-CM | POA: Diagnosis not present

## 2020-05-17 DIAGNOSIS — E1169 Type 2 diabetes mellitus with other specified complication: Secondary | ICD-10-CM

## 2020-05-17 DIAGNOSIS — I152 Hypertension secondary to endocrine disorders: Secondary | ICD-10-CM | POA: Diagnosis not present

## 2020-05-17 DIAGNOSIS — H6091 Unspecified otitis externa, right ear: Secondary | ICD-10-CM | POA: Diagnosis not present

## 2020-05-17 DIAGNOSIS — H9190 Unspecified hearing loss, unspecified ear: Secondary | ICD-10-CM

## 2020-05-17 DIAGNOSIS — Z7689 Persons encountering health services in other specified circumstances: Secondary | ICD-10-CM

## 2020-05-17 DIAGNOSIS — I252 Old myocardial infarction: Secondary | ICD-10-CM | POA: Insufficient documentation

## 2020-05-17 DIAGNOSIS — J4540 Moderate persistent asthma, uncomplicated: Secondary | ICD-10-CM | POA: Diagnosis not present

## 2020-05-17 DIAGNOSIS — E1140 Type 2 diabetes mellitus with diabetic neuropathy, unspecified: Secondary | ICD-10-CM | POA: Insufficient documentation

## 2020-05-17 DIAGNOSIS — Z23 Encounter for immunization: Secondary | ICD-10-CM

## 2020-05-17 DIAGNOSIS — M1990 Unspecified osteoarthritis, unspecified site: Secondary | ICD-10-CM | POA: Insufficient documentation

## 2020-05-17 DIAGNOSIS — G4700 Insomnia, unspecified: Secondary | ICD-10-CM | POA: Diagnosis not present

## 2020-05-17 DIAGNOSIS — J45909 Unspecified asthma, uncomplicated: Secondary | ICD-10-CM | POA: Insufficient documentation

## 2020-05-17 DIAGNOSIS — E119 Type 2 diabetes mellitus without complications: Secondary | ICD-10-CM | POA: Insufficient documentation

## 2020-05-17 DIAGNOSIS — T7840XA Allergy, unspecified, initial encounter: Secondary | ICD-10-CM | POA: Insufficient documentation

## 2020-05-17 DIAGNOSIS — E1159 Type 2 diabetes mellitus with other circulatory complications: Secondary | ICD-10-CM

## 2020-05-17 DIAGNOSIS — E785 Hyperlipidemia, unspecified: Secondary | ICD-10-CM

## 2020-05-17 DIAGNOSIS — K219 Gastro-esophageal reflux disease without esophagitis: Secondary | ICD-10-CM | POA: Diagnosis not present

## 2020-05-17 LAB — CBC WITH DIFFERENTIAL/PLATELET
Eos: 3 %
Hemoglobin: 16 g/dL (ref 13.0–17.7)
Monocytes Absolute: 0.7 10*3/uL (ref 0.1–0.9)
Neutrophils Absolute: 4.6 10*3/uL (ref 1.4–7.0)

## 2020-05-17 LAB — CMP14+EGFR

## 2020-05-17 LAB — BAYER DCA HB A1C WAIVED: HB A1C (BAYER DCA - WAIVED): 6.9 % (ref <7.0)

## 2020-05-17 MED ORDER — CIPROFLOXACIN-HYDROCORTISONE 0.2-1 % OT SUSP: 3.0000 [drp] | Freq: Two times a day (BID) | OTIC | 0 refills | Status: AC

## 2020-05-17 MED ORDER — ALBUTEROL SULFATE HFA 108 (90 BASE) MCG/ACT IN AERS: 2.0000 | INHALATION_SPRAY | Freq: Four times a day (QID) | RESPIRATORY_TRACT | 5 refills | Status: DC | PRN

## 2020-05-17 MED ORDER — TRAZODONE HCL 50 MG PO TABS: 25.0000 mg | ORAL_TABLET | Freq: Every evening | ORAL | 3 refills | Status: DC | PRN

## 2020-05-17 MED ORDER — JARDIANCE 10 MG PO TABS: 10.0000 mg | ORAL_TABLET | Freq: Every day | ORAL | 3 refills | Status: DC

## 2020-05-17 NOTE — Patient Instructions (Signed)
Diabetes Mellitus and Foot Care Foot care is an important part of your health, especially when you have diabetes. Diabetes may cause you to have problems because of poor blood flow (circulation) to your feet and legs, which can cause your skin to:  Become thinner and drier.  Break more easily.  Heal more slowly.  Peel and crack. You may also have nerve damage (neuropathy) in your legs and feet, causing decreased feeling in them. This means that you may not notice minor injuries to your feet that could lead to more serious problems. Noticing and addressing any potential problems early is the best way to prevent future foot problems. How to care for your feet Foot hygiene  Wash your feet daily with warm water and mild soap. Do not use hot water. Then, pat your feet and the areas between your toes until they are completely dry. Do not soak your feet as this can dry your skin.  Trim your toenails straight across. Do not dig under them or around the cuticle. File the edges of your nails with an emery board or nail file.  Apply a moisturizing lotion or petroleum jelly to the skin on your feet and to dry, brittle toenails. Use lotion that does not contain alcohol and is unscented. Do not apply lotion between your toes.   Shoes and socks  Wear clean socks or stockings every day. Make sure they are not too tight. Do not wear knee-high stockings since they may decrease blood flow to your legs.  Wear shoes that fit properly and have enough cushioning. Always look in your shoes before you put them on to be sure there are no objects inside.  To break in new shoes, wear them for just a few hours a day. This prevents injuries on your feet. Wounds, scrapes, corns, and calluses  Check your feet daily for blisters, cuts, bruises, sores, and redness. If you cannot see the bottom of your feet, use a mirror or ask someone for help.  Do not cut corns or calluses or try to remove them with medicine.  If you  find a minor scrape, cut, or break in the skin on your feet, keep it and the skin around it clean and dry. You may clean these areas with mild soap and water. Do not clean the area with peroxide, alcohol, or iodine.  If you have a wound, scrape, corn, or callus on your foot, look at it several times a day to make sure it is healing and not infected. Check for: ? Redness, swelling, or pain. ? Fluid or blood. ? Warmth. ? Pus or a bad smell.   General tips  Do not cross your legs. This may decrease blood flow to your feet.  Do not use heating pads or hot water bottles on your feet. They may burn your skin. If you have lost feeling in your feet or legs, you may not know this is happening until it is too late.  Protect your feet from hot and cold by wearing shoes, such as at the beach or on hot pavement.  Schedule a complete foot exam at least once a year (annually) or more often if you have foot problems. Report any cuts, sores, or bruises to your health care provider immediately. Where to find more information  American Diabetes Association: www.diabetes.org  Association of Diabetes Care & Education Specialists: www.diabeteseducator.org Contact a health care provider if:  You have a medical condition that increases your risk of infection and   you have any cuts, sores, or bruises on your feet.  You have an injury that is not healing.  You have redness on your legs or feet.  You feel burning or tingling in your legs or feet.  You have pain or cramps in your legs and feet.  Your legs or feet are numb.  Your feet always feel cold.  You have pain around any toenails. Get help right away if:  You have a wound, scrape, corn, or callus on your foot and: ? You have pain, swelling, or redness that gets worse. ? You have fluid or blood coming from the wound, scrape, corn, or callus. ? Your wound, scrape, corn, or callus feels warm to the touch. ? You have pus or a bad smell coming from  the wound, scrape, corn, or callus. ? You have a fever. ? You have a red line going up your leg. Summary  Check your feet every day for blisters, cuts, bruises, sores, and redness.  Apply a moisturizing lotion or petroleum jelly to the skin on your feet and to dry, brittle toenails.  Wear shoes that fit properly and have enough cushioning.  If you have foot problems, report any cuts, sores, or bruises to your health care provider immediately.  Schedule a complete foot exam at least once a year (annually) or more often if you have foot problems. This information is not intended to replace advice given to you by your health care provider. Make sure you discuss any questions you have with your health care provider. Document Revised: 09/16/2019 Document Reviewed: 09/16/2019 [employer] Patient Education  2021 [employer]  

## 2020-05-17 NOTE — Progress Notes (Addendum)
New Patient Office Visit  Subjective:  Patient ID: Anthony Swanson, male    DOB: Sep 24, 1950  Age: 70 y.o. MRN: [id number]  CC:  Chief Complaint  Patient presents with  . New Patient (Initial Visit)    HPI Anthony Swanson presents to establish care.   1. T2DM Patient denies foot ulcerations, hyperglycemia, increased appetite, paresthesia of the feet, polydipsia, visual disturbances, vomiting and weight loss.  Current diabetic medications include: glipizide, jardiance Compliant with meds - Yes  Current monitoring regimen: home blood tests - daily Home blood sugar records: fasting range: 112 Any episodes of hypoglycemia? no  Eye exam current (within one year): going tomorrow Is He on ACE inhibitor or angiotensin II receptor blocker?  Yes Is He on statin? Yes   2. HTN Complaint with meds - Yes Current Medications - losartan, HCTZ Pertinent ROS:  Headache - No Fatigue - No Visual Disturbances - No Chest pain - No Dyspnea - No Palpitations - No LE edema - No They report good compliance with medications and can restate their regimen by memory. No medication side effects.  Family, social, and smoking history reviewed.   3. Insomnia Dhyan reports insomnia. He has tried tizanidine without improvement. He has taken valium in the past to help with sleep and did well.    4. Asthma Reports wheezing at night. He uses his albuterol inhaler BID. He has nebulizer solution at home but needs a new nebulizer. Denies shortness of breath.   5. GERD Takes prilosec daily. Takes pepcid prn.   He would like to have his hearing checked.   Past Medical History:  Diagnosis Date  . Allergy   . Arthritis   . Asthma   . Diabetes mellitus   . Hyperlipidemia   . Hypertension   . Myocardial infarction [facility])     Past Surgical History:  Procedure Laterality Date  . APPENDECTOMY  2020    Family History  Problem Relation Age of Onset  . Heart attack Mother   . Stroke Father   .  Cancer Brother   . COPD Brother     Social History   Socioeconomic History  . Marital status: Widowed    Spouse name: Not on file  . Number of children: 3  . Years of education: 8  . Highest education level: 8th grade  Occupational History  . Occupation: retired  Tobacco Use  . Smoking status: Former Smoker    Packs/day: 1.00    Years: 6.00    Pack years: 6.00    Types: Cigarettes    Quit date: 1977    Years since quitting: 45.2  . Smokeless tobacco: Current User    Types: Chew  Vaping Use  . Vaping Use: Never used  Substance and Sexual Activity  . Alcohol use: No  . Drug use: No  . Sexual activity: Not Currently    Birth control/protection: None  Other Topics Concern  . Not on file  Social History Narrative   ** Merged History Encounter **       Social Determinants of Health   Financial Resource Strain: Not on file  Food Insecurity: Not on file  Transportation Needs: Not on file  Physical Activity: Not on file  Stress: Not on file  Social Connections: Not on file  Intimate Partner Violence: Not on file    ROS Review of Systems As per HPI.  Objective:   Today's Vitals: BP 135/76   Pulse 79   Temp 97.9 F (  36.6 C) (Temporal)   Wt 184 lb 6 oz (83.6 kg)   SpO2 96% Comment: room air  Physical Exam Vitals and nursing note reviewed.  Constitutional:      General: He is not in acute distress.    Appearance: Normal appearance. He is not ill-appearing.  HENT:     Right Ear: Tympanic membrane normal. Swelling (canal) present. No tenderness.     Left Ear: Tympanic membrane, ear canal and external ear normal.  Neck:     Vascular: No carotid bruit.  Cardiovascular:     Rate and Rhythm: Normal rate and regular rhythm.     Pulses: Normal pulses.     Heart sounds: Normal heart sounds. No murmur heard.   Pulmonary:     Effort: Pulmonary effort is normal. No respiratory distress.     Breath sounds: Normal breath sounds.  Abdominal:     General: Bowel  sounds are normal. There is no distension.     Palpations: Abdomen is soft. There is no mass.     Tenderness: There is no abdominal tenderness. There is no guarding or rebound.  Musculoskeletal:     Cervical back: Neck supple. No tenderness.     Right lower leg: No edema.     Left lower leg: No edema.  Lymphadenopathy:     Cervical: No cervical adenopathy.  Skin:    General: Skin is warm and dry.  Neurological:     General: No focal deficit present.     Mental Status: He is alert and oriented to person, place, and time.  Psychiatric:        Mood and Affect: Mood normal.        Behavior: Behavior normal.     Assessment & Plan:   Davis was seen today for new patient (initial visit).  Diagnoses and all orders for this visit:  Type 2 diabetes mellitus without complication, without long-term current use of insulin (HCC) A1c 6.9 today. Well controlled on current regimen. Labs pending as below. -     CMP14+EGFR -     CBC with Differential/Platelet -     Lipid panel -     Bayer DCA Hb A1c Waived -     Microalbumin / creatinine urine ratio -     JARDIANCE 10 MG TABS tablet; Take 1 tablet (10 mg total) by mouth daily.  Hypertension associated with diabetes ([place]) Well controlled on current regimen. Labs pending as below. -     CMP14+EGFR -     CBC with Differential/Platelet -     Lipid panel  Hyperlipidemia associated with type 2 diabetes mellitus ([place]) On statin therapy. Labs pending as below.  -     Lipid panel  Myocardial infarction, unspecified MI type, unspecified artery ([place]) History of mild MI years ago. Does not see cardiology.   Gastroesophageal reflux disease without esophagitis Well controlled on current regimen.   Moderate persistent asthma without complication Using albuterol inhaler 3x a day. Reports increased shortness of breath at night. He has used a albuterol nebulizer in the past with good relief of his symptoms. Order for nebulizer machine given as patient  would benefit from it's use. Discussed with patient at visit.  -     For home use only DME Nebulizer machine -     albuterol (VENTOLIN HFA) 108 (90 Base) MCG/ACT inhaler; Inhale 2 puffs into the lungs every 6 (six) hours as needed. For shortness of breath  Insomnia, unspecified type Will try trazodone. -  traZODone (DESYREL) 50 MG tablet; Take 0.5-1 tablets (25-50 mg total) by mouth at bedtime as needed for sleep.  Hearing loss, unspecified hearing loss type, unspecified laterality -     Ambulatory referral to Audiology  Otitis externa of right ear, unspecified chronicity, unspecified type Rx sent in as below. Handout given.  -     ciprofloxacin-hydrocortisone (CIPRO HC OTIC) OTIC suspension; Place 3 drops into the right ear 2 (two) times daily for 7 days.  Flu vaccine today in office.   Encounter to establish care Reviewed available records.    Follow-up: Return in about 3 months (around 08/17/2020) for chronic follow up.   The patient indicates understanding of these issues and agrees with the plan.    Gwenlyn Perking, FNP

## 2020-05-18 ENCOUNTER — Telehealth: Payer: Self-pay

## 2020-05-18 LAB — CBC WITH DIFFERENTIAL/PLATELET
Basophils Absolute: 0.1 10*3/uL (ref 0.0–0.2)
Basos: 1 %
EOS (ABSOLUTE): 0.2 10*3/uL (ref 0.0–0.4)
Hematocrit: 47.1 % (ref 37.5–51.0)
Immature Grans (Abs): 0 10*3/uL (ref 0.0–0.1)
Immature Granulocytes: 0 %
Lymphocytes Absolute: 1.9 10*3/uL (ref 0.7–3.1)
Lymphs: 25 %
MCH: 30.3 pg (ref 26.6–33.0)
MCHC: 34 g/dL (ref 31.5–35.7)
MCV: 89 fL (ref 79–97)
Monocytes: 9 %
Neutrophils: 62 %
Platelets: 224 10*3/uL (ref 150–450)
RBC: 5.28 x10E6/uL (ref 4.14–5.80)
RDW: 13.1 % (ref 11.6–15.4)
WBC: 7.5 10*3/uL (ref 3.4–10.8)

## 2020-05-18 LAB — MICROALBUMIN / CREATININE URINE RATIO
Creatinine, Urine: 22.4 mg/dL
Microalb/Creat Ratio: 18 mg/g creat (ref 0–29)
Microalbumin, Urine: 4 ug/mL

## 2020-05-18 LAB — CMP14+EGFR
ALT: 21 IU/L (ref 0–44)
AST: 22 IU/L (ref 0–40)
Albumin: 5 g/dL — ABNORMAL HIGH (ref 3.8–4.8)
BUN/Creatinine Ratio: 11 (ref 10–24)
CO2: 21 mmol/L (ref 20–29)
Calcium: 9.7 mg/dL (ref 8.6–10.2)
Chloride: 101 mmol/L (ref 96–106)
Creatinine, Ser: 0.97 mg/dL (ref 0.76–1.27)
Potassium: 4.3 mmol/L (ref 3.5–5.2)
Sodium: 139 mmol/L (ref 134–144)
Total Protein: 7.3 g/dL (ref 6.0–8.5)
eGFR: 84 mL/min/{1.73_m2} (ref >59)

## 2020-05-18 LAB — LIPID PANEL
Chol/HDL Ratio: 2.7 ratio (ref 0.0–5.0)
Cholesterol, Total: 147 mg/dL (ref 100–199)
HDL: 54 mg/dL (ref >39)
LDL Chol Calc (NIH): 74 mg/dL (ref 0–99)
Triglycerides: 105 mg/dL (ref 0–149)
VLDL Cholesterol Cal: 19 mg/dL (ref 5–40)

## 2020-05-18 NOTE — Telephone Encounter (Signed)
Tried calling pt multiple times to let him know that the OV notes that [facility] are requesting in order for him to get a nebulizer machine, was faxed to [phone].Marland Kitchen Pt did not answer.

## 2020-05-19 DIAGNOSIS — J449 Chronic obstructive pulmonary disease, unspecified: Secondary | ICD-10-CM | POA: Diagnosis not present

## 2020-05-23 DIAGNOSIS — H905 Unspecified sensorineural hearing loss: Secondary | ICD-10-CM | POA: Diagnosis not present

## 2020-05-29 ENCOUNTER — Telehealth: Payer: Self-pay | Admitting: Family Medicine

## 2020-05-29 DIAGNOSIS — E119 Type 2 diabetes mellitus without complications: Secondary | ICD-10-CM

## 2020-05-29 MED ORDER — ACCU-CHEK GUIDE VI STRP
ORAL_STRIP | 3 refills | Status: DC
Start: 2020-05-29 — End: 2023-05-15

## 2020-05-29 MED ORDER — ACCU-CHEK SOFTCLIX LANCETS MISC: 3 refills | Status: DC

## 2020-05-29 MED ORDER — ACCU-CHEK GUIDE W/DEVICE KIT: PACK | 0 refills | Status: DC

## 2020-05-29 NOTE — Telephone Encounter (Signed)
  Prescription Request  05/29/2020  What is the name of the medication or equipment? Accu-Check meter, test strips and lancets   Have you contacted your pharmacy to request a refill? (if applicable) no  Which pharmacy would you like this sent to? [facility]   Patient notified that their request is being sent to the clinical staff for review and that they should receive a response within 2 business days.

## 2020-05-29 NOTE — Telephone Encounter (Signed)
Pt aware sent to pharmacy 

## 2020-06-08 ENCOUNTER — Telehealth: Payer: Self-pay

## 2020-06-08 NOTE — Telephone Encounter (Signed)
Insurance will most likely not cover a scooter for leg pain. He can schedule a face to face appointment and we can try, however it will most likely be denied.

## 2020-06-08 NOTE — Telephone Encounter (Signed)
Please see previous response

## 2020-06-08 NOTE — Telephone Encounter (Signed)
Tiffany, is something you would do?  I think we would need to schedule him for an office visit to discuss before his insurance company would approve.

## 2020-06-09 NOTE — Telephone Encounter (Signed)
Patient aware, he wants to make an appointment to discuss, appointment scheduled next week with Tiffany.

## 2020-06-12 ENCOUNTER — Telehealth: Payer: Self-pay

## 2020-06-14 ENCOUNTER — Ambulatory Visit: Payer: Medicare Other | Admitting: Family Medicine

## 2020-06-15 ENCOUNTER — Encounter: Payer: Self-pay | Admitting: Family Medicine

## 2020-06-15 ENCOUNTER — Other Ambulatory Visit: Payer: Self-pay

## 2020-06-15 ENCOUNTER — Ambulatory Visit (INDEPENDENT_AMBULATORY_CARE_PROVIDER_SITE_OTHER): Payer: Medicare Other | Admitting: Family Medicine

## 2020-06-15 VITALS — BP 116/71 | HR 70 | Temp 97.5°F | Ht 66.0 in | Wt 186.4 lb

## 2020-06-15 DIAGNOSIS — E119 Type 2 diabetes mellitus without complications: Secondary | ICD-10-CM

## 2020-06-15 DIAGNOSIS — M79604 Pain in right leg: Secondary | ICD-10-CM

## 2020-06-15 DIAGNOSIS — M79605 Pain in left leg: Secondary | ICD-10-CM | POA: Diagnosis not present

## 2020-06-15 MED ORDER — GABAPENTIN 600 MG PO TABS
300.0000 mg | ORAL_TABLET | Freq: Every day | ORAL | 0 refills | Status: DC
Start: 2020-06-15 — End: 2020-09-04

## 2020-06-15 NOTE — Patient Instructions (Signed)
Diabetic Neuropathy Diabetic neuropathy refers to nerve damage that is caused by diabetes. Over time, people with diabetes can develop nerve damage throughout the body. There are several types of diabetic neuropathy:  Peripheral neuropathy. This is the most common type of diabetic neuropathy. It damages the nerves that carry signals between the spinal cord and other parts of the body (peripheral nerves). This usually affects nerves in the feet, legs, hands, and arms.  Autonomic neuropathy. This type causes damage to nerves that control involuntary functions (autonomic nerves). Involuntary functions are functions of the body that you do not control. They include heartbeat, body temperature, blood pressure, urination, digestion, sweating, sexual function, or response to changes in blood glucose.  Focal neuropathy. This type of nerve damage affects one area of the body, such as an arm, a leg, or the face. The injury may involve one nerve or a small group of nerves. Focal neuropathy can be painful and unpredictable. It occurs most often in older adults with diabetes. This often develops suddenly, but usually improves over time and does not cause long-term problems.  Proximal neuropathy. This type of nerve damage affects the nerves of the thighs, hips, buttocks, or legs. It causes severe pain, weakness, and muscle death (atrophy), usually in the thigh muscles. It is more common among older men and people who have type 2 diabetes. The length of recovery time may vary. What are the causes? Peripheral, autonomic, and focal neuropathies are caused by diabetes that is not well controlled with treatment. The cause of proximal neuropathy is not known, but it may be caused by inflammation related to uncontrolled blood glucose levels. What are the signs or symptoms? Peripheral neuropathy Peripheral neuropathy develops slowly over time. When the nerves of the feet and legs no longer work, you may  experience:  Burning, stabbing, or aching pain in the legs or feet.  Pain or cramping in the legs or feet.  Loss of feeling (numbness) and inability to feel pressure or pain in the feet. This can lead to: ? Thick calluses or sores on areas of constant pressure. ? Ulcers. ? Reduced ability to feel temperature changes.  Foot deformities.  Muscle weakness.  Loss of balance or coordination. Autonomic neuropathy The symptoms of autonomic neuropathy vary depending on which nerves are affected. Symptoms may include:  Problems with digestion, such as: ? Nausea or vomiting. ? Poor appetite. ? Bloating. ? Diarrhea or constipation. ? Trouble swallowing. ? Losing weight without trying to.  Problems with the heart, blood, and lungs, such as: ? Dizziness, especially when standing up. ? Fainting. ? Shortness of breath. ? Irregular heartbeat.  Bladder problems, such as: ? Trouble starting or stopping urination. ? Leaking urine. ? Trouble emptying the bladder. ? Urinary tract infections (UTIs).  Problems with other body functions, such as: ? Sweat. You may sweat too much or too little. ? Temperature. You might get hot easily. Or, you might feel cold more than usual. ? Sexual function. Men may not be able to get or maintain an erection. Women may have vaginal dryness and difficulty with arousal. Focal neuropathy Symptoms affect only one area of the body. Common symptoms include:  Numbness.  Tingling.  Burning pain.  Prickling feeling.  Very sensitive skin.  Weakness.  Inability to move (paralysis).  Muscle twitching.  Muscles getting smaller (wasting).  Poor coordination.  Double or blurred vision. Proximal neuropathy  Sudden, severe pain in the hip, thigh, or buttocks. Pain may spread from the back into the legs (  sciatica).  Pain and numbness in the arms and legs.  Tingling.  Loss of bladder control or bowel control.  Weakness and wasting of thigh  muscles.  Difficulty getting up from a seated position.  Abdominal swelling.  Unexplained weight loss. How is this diagnosed? Diagnosis varies depending on the type of neuropathy your health care provider suspects. Peripheral neuropathy Your health care provider will do a neurologic exam. This exam checks your reflexes, how you move, and what you can feel. You may have other tests, such as:  Blood tests.  Tests of the fluid that surrounds the spinal cord (lumbar puncture).  CT scan.  MRI.  Checking the nerves that control muscles (electromyogram, or EMG).  Checking how quickly signals pass through your nerves (nerve conduction study).  Checking a small piece of a nerve using a microscope (biopsy). Autonomic neuropathy You may have tests, such as:  Tests to measure your blood pressure and heart rate. You may be secured to an exam table that moves you from a lying position to an upright position (table tilt test).  Breathing tests to check your lungs.  Tests to check how food moves through the digestive system (gastric emptying tests).  Blood, sweat, or urine tests.  Ultrasound of your bladder.  Spinal fluid tests. Focal neuropathy This condition may be diagnosed with:  A neurologic exam.  CT scan.  MRI.  EMG.  Nerve conduction study. Proximal neuropathy There is no test to diagnose this type of neuropathy. You may have tests to rule out other possible causes of this type of neuropathy. Tests may include:  X-rays of your spine and lumbar region.  Lumbar puncture.  MRI. How is this treated? The goal of treatment is to keep nerve damage from getting worse. Treatment may include:  Following your diabetes management plan. This will help keep your blood glucose level and your A1C level within your target range. This is the most important treatment.  Using prescription pain medicine. Follow these instructions at home: Diabetes management Follow your diabetes  management plan as told by your health care provider.  Check your blood glucose levels.  Keep your blood glucose in your target range.  Have your A1C level checked at least two times a year, or as often as told.  Take over the counter and prescription medicines only as told by your health care provider. This includes insulin and diabetes medicine.   Lifestyle  Do not use any products that contain nicotine or tobacco, such as cigarettes, e-cigarettes, and chewing tobacco. If you need help quitting, ask your health care provider.  Be physically active every day. Include strength training and balance exercises.  Follow a healthy meal plan.  Work with your health care provider to manage your blood pressure.   General instructions  Ask your health care provider if the medicine prescribed to you requires you to avoid driving or using machinery.  Check your skin and feet every day for cuts, bruises, redness, blisters, or sores.  Keep all follow-up visits. This is important. Contact a health care provider if:  You have burning, stabbing, or aching pain in your legs or feet.  You are unable to feel pressure or pain in your feet.  You develop problems with digestion, such as: ? Nausea. ? Vomiting. ? Bloating. ? Constipation. ? Diarrhea. ? Abdominal pain.  You have difficulty with urination, such as: ? Inability to control when you urinate (incontinence). ? Inability to completely empty the bladder (retention).  You feel   as if your heart is racing (palpitations).  You feel dizzy, weak, or faint when you stand up. Get help right away if:  You cannot urinate.  You have sudden weakness or loss of coordination.  You have trouble speaking.  You have pain or pressure in your chest.  You have an irregular heartbeat.  You have sudden inability to move a part of your body. These symptoms may represent a serious problem that is an emergency. Do not wait to see if the symptoms  will go away. Get medical help right away. Call your local emergency services (911 in the [place] Do not drive yourself to the hospital. Summary  Diabetic neuropathy is nerve damage that is caused by diabetes. It can cause numbness and pain in the arms, legs, digestive tract, heart, and other body systems.  This condition is treated by keeping your blood glucose level and your A1C level within your target range. This can help prevent neuropathy from getting worse.  Check your skin and feet every day for cuts, bruises, redness, blisters, or sores.  Do not use any products that contain nicotine or tobacco, such as cigarettes, e-cigarettes, and chewing tobacco. This information is not intended to replace advice given to you by your health care provider. Make sure you discuss any questions you have with your health care provider. Document Revised: 07/08/2019 Document Reviewed: 07/08/2019 Elsevier Patient Education  2021 Elsevier Inc.  

## 2020-06-15 NOTE — Progress Notes (Signed)
Established Patient Office Visit  Subjective:  Patient ID: Anthony Swanson, male    DOB: Apr 05, 1950  Age: 70 y.o. MRN: [id number]  CC:  Chief Complaint  Patient presents with  . Leg Pain    HPI Anthony Swanson presents for bilateral leg pain x 3 days. The pain is achy. It is moderate. He has been taking tylenol with some improvement. Resting makes the pain better. Activity makes the pain worse. He denies numbness or tingling. Denies fever, erythema, or swelling. Denies cramping. He has not had any injuries. He used to have diabetic shoes but no longer does. He would like diabetic shoes.   Past Medical History:  Diagnosis Date  . Allergy   . Arthritis   . Asthma   . Diabetes mellitus   . Hyperlipidemia   . Hypertension   . Myocardial infarction [facility])     Past Surgical History:  Procedure Laterality Date  . APPENDECTOMY  2020    Family History  Problem Relation Age of Onset  . Heart attack Mother   . Stroke Father   . Cancer Brother   . COPD Brother     Social History   Socioeconomic History  . Marital status: Widowed    Spouse name: Not on file  . Number of children: 3  . Years of education: 8  . Highest education level: 8th grade  Occupational History  . Occupation: retired  Tobacco Use  . Smoking status: Former Smoker    Packs/day: 1.00    Years: 6.00    Pack years: 6.00    Types: Cigarettes    Quit date: 1977    Years since quitting: 45.2  . Smokeless tobacco: Current User    Types: Chew  Vaping Use  . Vaping Use: Never used  Substance and Sexual Activity  . Alcohol use: No  . Drug use: No  . Sexual activity: Not Currently    Birth control/protection: None  Other Topics Concern  . Not on file  Social History Narrative   ** Merged History Encounter **       Social Determinants of Health   Financial Resource Strain: Not on file  Food Insecurity: Not on file  Transportation Needs: Not on file  Physical Activity: Not on file  Stress: Not on  file  Social Connections: Not on file  Intimate Partner Violence: Not on file    Outpatient Medications Prior to Visit  Medication Sig Dispense Refill  . ACCU-CHEK GUIDE test strip Test BS daily Dx E11.9 100 each 3  . Accu-Chek Softclix Lancets lancets Test BS daily Dx E11.9 100 each 3  . acetaminophen (TYLENOL) 500 MG tablet Take 500 mg by mouth every 6 (six) hours as needed. For pain    . albuterol (PROVENTIL) (2.5 MG/3ML) 0.083% nebulizer solution SMARTSIG:1 Vial(s) Via Nebulizer 4 Times Daily PRN    . albuterol (VENTOLIN HFA) 108 (90 Base) MCG/ACT inhaler Inhale 2 puffs into the lungs every 6 (six) hours as needed. For shortness of breath 18 g 5  . Blood Glucose Monitoring Suppl (ACCU-CHEK GUIDE) w/Device KIT Test BS daily Dx E11.9 1 kit 0  . famotidine (PEPCID) 20 MG tablet Take 20 mg by mouth daily.    Marland Kitchen glipiZIDE (GLUCOTROL) 10 MG tablet Take 10 mg by mouth 2 (two) times daily.    . hydrochlorothiazide (HYDRODIURIL) 25 MG tablet Take 25 mg by mouth daily.    Marland Kitchen JARDIANCE 10 MG TABS tablet Take 1 tablet (10 mg total)  by mouth daily. 90 tablet 3  . losartan (COZAAR) 50 MG tablet Take 50 mg by mouth daily.    Marland Kitchen omeprazole (PRILOSEC) 40 MG capsule Take 40 mg by mouth every morning.    . simvastatin (ZOCOR) 20 MG tablet Take 20 mg by mouth every morning.    . traZODone (DESYREL) 50 MG tablet Take 0.5-1 tablets (25-50 mg total) by mouth at bedtime as needed for sleep. 30 tablet 3   No facility-administered medications prior to visit.    Allergies  Allergen Reactions  . Codeine   . Penicillins   . Percocet [Oxycodone-Acetaminophen]   . Sulfa Antibiotics     ROS Review of Systems As per HPI.    Objective:    Physical Exam Vitals and nursing note reviewed.  Constitutional:      General: He is not in acute distress.    Appearance: Normal appearance. He is not ill-appearing, toxic-appearing or diaphoretic.  HENT:     Head: Normocephalic and atraumatic.  Musculoskeletal:      Right lower leg: Normal. No edema.     Left lower leg: Normal. No edema.     Right ankle: Normal.     Left ankle: Normal.  Skin:    General: Skin is warm and dry.     Findings: No erythema.  Neurological:     General: No focal deficit present.     Mental Status: He is alert and oriented to person, place, and time.  Psychiatric:        Mood and Affect: Mood normal.        Behavior: Behavior normal.        Thought Content: Thought content normal.        Judgment: Judgment normal.    Diabetic Foot Exam - Simple   Simple Foot Form Diabetic Foot exam was performed with the following findings: Yes 06/15/2020 12:05 PM  Visual Inspection See comments: Yes Sensation Testing Intact to touch and monofilament testing bilaterally: Yes Pulse Check Posterior Tibialis and Dorsalis pulse intact bilaterally: Yes Comments Calluses present to lateral portion of great toe bilaterally.      BP 116/71   Pulse 70   Temp (!) 97.5 F (36.4 C) (Temporal)   Ht 5' 6"  (1.676 m)   Wt 186 lb 6 oz (84.5 kg)   SpO2 94% Comment: room air  BMI 30.08 kg/m  Wt Readings from Last 3 Encounters:  06/15/20 186 lb 6 oz (84.5 kg)  05/17/20 184 lb 6 oz (83.6 kg)     Health Maintenance Due  Topic Date Due  . Hepatitis C Screening  Never done  . FOOT EXAM  Never done  . OPHTHALMOLOGY EXAM  Never done  . PNA vac Low Risk Adult (2 of 2 - PPSV23) 06/05/2017  . COVID-19 Vaccine (3 - Booster for Moderna series) 02/16/2020    There are no preventive care reminders to display for this patient.  No results found for: TSH Lab Results  Component Value Date   WBC 7.5 05/17/2020   HGB 16.0 05/17/2020   HCT 47.1 05/17/2020   MCV 89 05/17/2020   PLT 224 05/17/2020   Lab Results  Component Value Date   NA 139 05/17/2020   K 4.3 05/17/2020   CO2 21 05/17/2020   GLUCOSE 102 (H) 05/17/2020   BUN 11 05/17/2020   CREATININE 0.97 05/17/2020   BILITOT 0.4 05/17/2020   ALKPHOS 56 05/17/2020   AST 22 05/17/2020    ALT 21 05/17/2020  PROT 7.3 05/17/2020   ALBUMIN 5.0 (H) 05/17/2020   CALCIUM 9.7 05/17/2020   Lab Results  Component Value Date   CHOL 147 05/17/2020   Lab Results  Component Value Date   HDL 54 05/17/2020   Lab Results  Component Value Date   LDLCALC 74 05/17/2020   Lab Results  Component Value Date   TRIG 105 05/17/2020   Lab Results  Component Value Date   CHOLHDL 2.7 05/17/2020   Lab Results  Component Value Date   HGBA1C 6.9 05/17/2020      Assessment & Plan:   Anthony Swanson was seen today for leg pain.  Diagnoses and all orders for this visit:  Bilateral leg pain Try gabapentin for neuropathy. Labs pending as below.  -     CMP14+EGFR -     Arthritis Panel -     gabapentin (NEURONTIN) 600 MG tablet; Take 0.5 tablets (300 mg total) by mouth at bedtime.  Type 2 diabetes mellitus without complication, without long-term current use of insulin ([place]) Foot exam today- calluses present. Will fax this note along with order for diabetic shoes to [facility]. Labs pending as below. Start gabapentin for neuropathy.  -     CMP14+EGFR -     gabapentin (NEURONTIN) 600 MG tablet; Take 0.5 tablets (300 mg total) by mouth at bedtime. -     For Home Use Only DME Diabetic Shoe   Follow-up: Return if symptoms worsen or fail to improve. Keep scheduled chronic follow up appointment.    The patient indicates understanding of these issues and agrees with the plan.  Gwenlyn Perking, FNP

## 2020-06-16 LAB — CMP14+EGFR
ALT: 22 IU/L (ref 0–44)
AST: 22 IU/L (ref 0–40)
Albumin/Globulin Ratio: 1.9 (ref 1.2–2.2)
Albumin: 4.6 g/dL (ref 3.8–4.8)
Alkaline Phosphatase: 55 IU/L (ref 44–121)
BUN/Creatinine Ratio: 13 (ref 10–24)
BUN: 13 mg/dL (ref 8–27)
Bilirubin Total: 0.3 mg/dL (ref 0.0–1.2)
CO2: 22 mmol/L (ref 20–29)
Calcium: 9.7 mg/dL (ref 8.6–10.2)
Chloride: 100 mmol/L (ref 96–106)
Creatinine, Ser: 0.97 mg/dL (ref 0.76–1.27)
Globulin, Total: 2.4 g/dL (ref 1.5–4.5)
Glucose: 156 mg/dL — ABNORMAL HIGH (ref 65–99)
Potassium: 4.7 mmol/L (ref 3.5–5.2)
Sodium: 142 mmol/L (ref 134–144)
Total Protein: 7 g/dL (ref 6.0–8.5)
eGFR: 84 mL/min/{1.73_m2} (ref >59)

## 2020-06-16 LAB — ARTHRITIS PANEL
Basophils Absolute: 0.1 10*3/uL (ref 0.0–0.2)
Basos: 1 %
EOS (ABSOLUTE): 0.2 10*3/uL (ref 0.0–0.4)
Eos: 3 %
Hematocrit: 49.5 % (ref 37.5–51.0)
Hemoglobin: 16.2 g/dL (ref 13.0–17.7)
Immature Grans (Abs): 0 10*3/uL (ref 0.0–0.1)
Immature Granulocytes: 1 %
Lymphocytes Absolute: 1.8 10*3/uL (ref 0.7–3.1)
Lymphs: 21 %
MCH: 29.7 pg (ref 26.6–33.0)
MCHC: 32.7 g/dL (ref 31.5–35.7)
MCV: 91 fL (ref 79–97)
Monocytes Absolute: 0.6 10*3/uL (ref 0.1–0.9)
Monocytes: 7 %
Neutrophils Absolute: 5.8 10*3/uL (ref 1.4–7.0)
Neutrophils: 67 %
Platelets: 206 10*3/uL (ref 150–450)
RBC: 5.45 x10E6/uL (ref 4.14–5.80)
RDW: 13.1 % (ref 11.6–15.4)
Rheumatoid fact SerPl-aCnc: <10 IU/mL (ref <14.0)
Sed Rate: 2 mm/hr (ref 0–30)
Uric Acid: 6 mg/dL (ref 3.8–8.4)
WBC: 8.4 10*3/uL (ref 3.4–10.8)

## 2020-06-19 ENCOUNTER — Other Ambulatory Visit: Payer: Self-pay | Admitting: *Deleted

## 2020-06-19 DIAGNOSIS — J449 Chronic obstructive pulmonary disease, unspecified: Secondary | ICD-10-CM | POA: Diagnosis not present

## 2020-06-19 DIAGNOSIS — J4540 Moderate persistent asthma, uncomplicated: Secondary | ICD-10-CM

## 2020-06-19 MED ORDER — ALBUTEROL SULFATE HFA 108 (90 BASE) MCG/ACT IN AERS: 2.0000 | INHALATION_SPRAY | Freq: Four times a day (QID) | RESPIRATORY_TRACT | 5 refills | Status: DC | PRN

## 2020-06-26 ENCOUNTER — Telehealth: Payer: Self-pay

## 2020-06-26 DIAGNOSIS — K219 Gastro-esophageal reflux disease without esophagitis: Secondary | ICD-10-CM

## 2020-06-26 MED ORDER — OMEPRAZOLE 40 MG PO CPDR: 40.0000 mg | DELAYED_RELEASE_CAPSULE | Freq: Every morning | ORAL | 1 refills | Status: DC

## 2020-06-26 NOTE — Telephone Encounter (Signed)
Called and left message advising refill was sent in and to call back with any questions or concerns.

## 2020-06-26 NOTE — Telephone Encounter (Signed)
This is a known medication that pt is taking but he didn't need any refills at his new pt appt. Per Harlow Mares did send in refills.

## 2020-06-26 NOTE — Telephone Encounter (Signed)
  Prescription Request  06/26/2020  What is the name of the medication or equipment? omeprazole (PRILOSEC) 40 MG capsule    Have you contacted your pharmacy to request a refill? (if applicable) yes  Which pharmacy would you like this sent to? [facility]    Patient notified that their request is being sent to the clinical staff for review and that they should receive a response within 2 business days.

## 2020-06-28 ENCOUNTER — Ambulatory Visit: Payer: Self-pay | Admitting: Nurse Practitioner

## 2020-06-29 ENCOUNTER — Telehealth: Payer: Self-pay

## 2020-06-29 NOTE — Telephone Encounter (Signed)
Patient aware and verbalized understanding. °

## 2020-06-29 NOTE — Telephone Encounter (Signed)
Shoes was ordered this day  and sent to [facility] with ov notes

## 2020-07-01 DIAGNOSIS — M79641 Pain in right hand: Secondary | ICD-10-CM | POA: Diagnosis not present

## 2020-07-01 DIAGNOSIS — W228XXA Striking against or struck by other objects, initial encounter: Secondary | ICD-10-CM | POA: Diagnosis not present

## 2020-07-01 DIAGNOSIS — S61216A Laceration without foreign body of right little finger without damage to nail, initial encounter: Secondary | ICD-10-CM | POA: Diagnosis not present

## 2020-07-01 DIAGNOSIS — T1490XA Injury, unspecified, initial encounter: Secondary | ICD-10-CM | POA: Diagnosis not present

## 2020-07-03 ENCOUNTER — Encounter: Payer: Self-pay | Admitting: Family Medicine

## 2020-07-03 ENCOUNTER — Other Ambulatory Visit: Payer: Self-pay

## 2020-07-03 ENCOUNTER — Ambulatory Visit (INDEPENDENT_AMBULATORY_CARE_PROVIDER_SITE_OTHER): Payer: Medicare Other | Admitting: Family Medicine

## 2020-07-03 VITALS — BP 129/69 | HR 73 | Temp 97.5°F | Ht 66.0 in | Wt 191.1 lb

## 2020-07-03 DIAGNOSIS — S61216D Laceration without foreign body of right little finger without damage to nail, subsequent encounter: Secondary | ICD-10-CM

## 2020-07-03 MED ORDER — CEFTRIAXONE SODIUM 1 G IJ SOLR
1.0000 g | Freq: Once | INTRAMUSCULAR | Status: AC
  Administered 2020-07-03: 1 g via INTRAMUSCULAR

## 2020-07-03 NOTE — Progress Notes (Signed)
Acute Office Visit  Subjective:    Patient ID: Anthony Swanson, male    DOB: April 13, 1950, 70 y.o.   MRN: [id number]  Chief Complaint  Patient presents with  . Finger Injury    HPI Patient is in today for a finger injury 4 days ago. He reports getting his right pinky in a wood chipper. He was seen at a [facility] urgent care shortly after. He was given a prescription for clindamycin 300 mg TID x 10 days. He has his medications delivered from [facility]. He has not been delivered yet, but will be this afternoon. An xray but obtained but has not been read yet. He has been keeping the wounds bandaged. He has been using an antibiotic ointment and wearing a finger splint. It is drainage serosanguinous fluid. He reports the pain has improved. Denies fever, tenderness, or chills. He is UTD on tetanus vaccine.   Past Medical History:  Diagnosis Date  . Allergy   . Arthritis   . Asthma   . Diabetes mellitus   . Hyperlipidemia   . Hypertension   . Myocardial infarction [facility])     Past Surgical History:  Procedure Laterality Date  . APPENDECTOMY  2020    Family History  Problem Relation Age of Onset  . Heart attack Mother   . Stroke Father   . Cancer Brother   . COPD Brother     Social History   Socioeconomic History  . Marital status: Widowed    Spouse name: Not on file  . Number of children: 3  . Years of education: 8  . Highest education level: 8th grade  Occupational History  . Occupation: retired  Tobacco Use  . Smoking status: Former Smoker    Packs/day: 1.00    Years: 6.00    Pack years: 6.00    Types: Cigarettes    Quit date: 1977    Years since quitting: 45.3  . Smokeless tobacco: Current User    Types: Chew  Vaping Use  . Vaping Use: Never used  Substance and Sexual Activity  . Alcohol use: No  . Drug use: No  . Sexual activity: Not Currently    Birth control/protection: None  Other Topics Concern  . Not on file  Social History Narrative   ** Merged History  Encounter **       Social Determinants of Health   Financial Resource Strain: Not on file  Food Insecurity: Not on file  Transportation Needs: Not on file  Physical Activity: Not on file  Stress: Not on file  Social Connections: Not on file  Intimate Partner Violence: Not on file    Outpatient Medications Prior to Visit  Medication Sig Dispense Refill  . ACCU-CHEK GUIDE test strip Test BS daily Dx E11.9 100 each 3  . Accu-Chek Softclix Lancets lancets Test BS daily Dx E11.9 100 each 3  . acetaminophen (TYLENOL) 500 MG tablet Take 500 mg by mouth every 6 (six) hours as needed. For pain    . albuterol (PROVENTIL) (2.5 MG/3ML) 0.083% nebulizer solution SMARTSIG:1 Vial(s) Via Nebulizer 4 Times Daily PRN    . albuterol (VENTOLIN HFA) 108 (90 Base) MCG/ACT inhaler Inhale 2 puffs into the lungs every 6 (six) hours as needed. For shortness of breath 18 g 5  . Blood Glucose Monitoring Suppl (ACCU-CHEK GUIDE) w/Device KIT Test BS daily Dx E11.9 1 kit 0  . famotidine (PEPCID) 20 MG tablet Take 20 mg by mouth daily.    Marland Kitchen  gabapentin (NEURONTIN) 600 MG tablet Take 0.5 tablets (300 mg total) by mouth at bedtime. 30 tablet 0  . glipiZIDE (GLUCOTROL) 10 MG tablet Take 10 mg by mouth 2 (two) times daily.    . hydrochlorothiazide (HYDRODIURIL) 25 MG tablet Take 25 mg by mouth daily.    Marland Kitchen JARDIANCE 10 MG TABS tablet Take 1 tablet (10 mg total) by mouth daily. 90 tablet 3  . losartan (COZAAR) 50 MG tablet Take 50 mg by mouth daily.    Marland Kitchen omeprazole (PRILOSEC) 40 MG capsule Take 1 capsule (40 mg total) by mouth every morning. 90 capsule 1  . simvastatin (ZOCOR) 20 MG tablet Take 20 mg by mouth every morning.    . clindamycin (CLEOCIN) 300 MG capsule Take 300 mg by mouth 3 (three) times daily. (Patient not taking: Reported on 07/03/2020)     No facility-administered medications prior to visit.    Allergies  Allergen Reactions  . Codeine   . Penicillins   . Percocet [Oxycodone-Acetaminophen]   . Sulfa  Antibiotics     Review of Systems As per HPI.     Objective:    Physical Exam Vitals and nursing note reviewed.  Constitutional:      General: He is not in acute distress.    Appearance: Normal appearance. He is not ill-appearing, toxic-appearing or diaphoretic.  Pulmonary:     Effort: Pulmonary effort is normal. No respiratory distress.  Musculoskeletal:     Comments: Open wound to right proximal phalanx to little finger. Scant seriosangious drainage present. No tenderness or surrounding erythema. Generalized swelling to right little finger. Sensation intact.   Skin:    General: Skin is warm and dry.  Neurological:     General: No focal deficit present.     Mental Status: He is alert and oriented to person, place, and time.     BP 129/69   Pulse 73   Temp (!) 97.5 F (36.4 C) (Temporal)   Ht 5' 6"  (1.676 m)   Wt 191 lb 2 oz (86.7 kg)   BMI 30.85 kg/m  Wt Readings from Last 3 Encounters:  07/03/20 191 lb 2 oz (86.7 kg)  06/15/20 186 lb 6 oz (84.5 kg)  05/17/20 184 lb 6 oz (83.6 kg)    Health Maintenance Due  Topic Date Due  . Hepatitis C Screening  Never done  . OPHTHALMOLOGY EXAM  Never done  . PNA vac Low Risk Adult (2 of 2 - PPSV23) 06/05/2017  . COVID-19 Vaccine (3 - Booster for Moderna series) 02/16/2020    There are no preventive care reminders to display for this patient.   No results found for: TSH Lab Results  Component Value Date   WBC 8.4 06/15/2020   HGB 16.2 06/15/2020   HCT 49.5 06/15/2020   MCV 91 06/15/2020   PLT 206 06/15/2020   Lab Results  Component Value Date   NA 142 06/15/2020   K 4.7 06/15/2020   CO2 22 06/15/2020   GLUCOSE 156 (H) 06/15/2020   BUN 13 06/15/2020   CREATININE 0.97 06/15/2020   BILITOT 0.3 06/15/2020   ALKPHOS 55 06/15/2020   AST 22 06/15/2020   ALT 22 06/15/2020   PROT 7.0 06/15/2020   ALBUMIN 4.6 06/15/2020   CALCIUM 9.7 06/15/2020   EGFR 84 06/15/2020   Lab Results  Component Value Date   CHOL 147  05/17/2020   Lab Results  Component Value Date   HDL 54 05/17/2020   Lab Results  Component  Value Date   LDLCALC 74 05/17/2020   Lab Results  Component Value Date   TRIG 105 05/17/2020   Lab Results  Component Value Date   CHOLHDL 2.7 05/17/2020   Lab Results  Component Value Date   HGBA1C 6.9 05/17/2020       Assessment & Plan:   Anthony Swanson was seen today for finger injury.  Diagnoses and all orders for this visit:  Laceration of right little finger without foreign body, nail damage status unspecified, subsequent encounter Rocephin injection today in office. Start clindamycin when it is delivered. Patient will call [facility] urgent care to request xray results. Continue to dress and splint finger. Keep wound clean and dry. Handout given. -     cefTRIAXone (ROCEPHIN) injection 1 g  Return if symptoms worsen or fail to improve.  The patient indicates understanding of these issues and agrees with the plan.  Gwenlyn Perking, FNP

## 2020-07-03 NOTE — Patient Instructions (Signed)
Laceration Care, Adult A laceration is a cut that may go through all layers of the skin. The cut may also go into the tissue that is right under the skin. Some cuts heal on their own. Others need to be closed with stitches (sutures), staples, skin adhesive strips, or skin glue. Taking care of your injury lowers your risk of infection, helps your injury to heal better, and may prevent scarring. Supplies needed:  Soap.  Water.  Hand sanitizer.  Bandage (dressing).  Antibiotic ointment.  Clean towel. How to take care of your cut Wash your hands with soap and water before touching your wound or changing your bandage. If soap and water are not available, use hand sanitizer. If your doctor used stitches or staples:  Keep the wound clean and dry.  If you were given a bandage, change it at least once a day as told by your doctor. You should also change it if it gets wet or dirty.  Keep the wound completely dry for the first 24 hours, or as told by your doctor. After that, you may take a shower or a bath. Do not get the wound soaked in water until after the stitches or staples have been removed.  Clean the wound once a day, or as told by your doctor: ? Wash the wound with soap and water. ? Rinse the wound with water to remove all soap. ? Pat the wound dry with a clean towel. Do not rub the wound.  After you clean the wound, put a thin layer of antibiotic ointment on it as told by your doctor. This ointment: ? Helps to prevent infection. ? Keeps the bandage from sticking to the wound.  Have your stitches or staples removed as told by your doctor. If your doctor used skin adhesive strips:  Keep the wound clean and dry.  If you were given a bandage, you should change it at least once a day as told by your doctor. You should also change it if it gets wet or dirty.  Do not get the skin adhesive strips wet. You can take a shower or a bath, but keep the wound dry.  If the wound gets wet,  pat it dry with a clean towel. Do not rub the wound.  Skin adhesive strips fall off on their own. You can trim the strips as the wound heals. Do not remove any strips that are still stuck to the wound. They will fall off after a while. If your doctor used skin glue:  Try to keep your wound dry, but you may briefly wet it in the shower or bath. Do not soak the wound in water, such as by swimming.  After you take a shower or a bath, gently pat the wound dry with a clean towel. Do not rub the wound.  Do not do any activities that will make you really sweaty until the skin glue has fallen off on its own.  Do not apply liquid, cream, or ointment medicine to your wound while the skin glue is still on.  If you were given a bandage, you should change it at least once a day or as told by your doctor. You should also change it if it gets dirty or wet.  If a bandage is placed over the wound, do not let the tape touch the skin glue.  Do not pick at the glue. The skin glue usually stays on for 5-10 days. Then, it falls off the skin. General   instructions  Take over-the-counter and prescription medicines only as told by your doctor.  If you were given antibiotic medicine or ointment, take or apply it as told by your doctor. Do not stop using it even if your condition improves.  Do not scratch or pick at the wound.  Check your wound every day for signs of infection. Watch for: ? Redness, swelling, or pain. ? Fluid, blood, or pus.  Raise (elevate) the injured area above the level of your heart while you are sitting or lying down.  If directed, put ice on the affected area: ? Put ice in a plastic bag. ? Place a towel between your skin and the bag. ? Leave the ice on for 20 minutes, 2-3 times a day.  Prevent scarring by covering your wound with sunscreen of at least 30 SPF whenever you are outside after your wound has healed.  Keep all follow-up visits as told by your doctor. This is important.    Get help if:  You got a tetanus shot and you have any of these problems at the injection site: ? Swelling. ? Very bad pain. ? Redness. ? Bleeding.  You have a fever.  A wound that was closed breaks open.  You notice a bad smell coming from your wound or your bandage.  You notice something coming out of the wound, such as wood or glass.  Medicine does not relieve your pain.  You have more redness, swelling, or pain at the site of your wound.  You have fluid, blood, or pus coming from your wound.  You notice a change in the color of your skin near your wound.  You need to change the bandage often because fluid, blood, or pus is coming from the wound.  You start to have a new rash.  You start to have numbness around the wound. Get help right away if:  You have very bad swelling around the wound.  Your pain suddenly gets worse and is very bad.  You notice painful lumps near the wound or anywhere on your body.  You have a red streak going away from your wound.  The wound is on your hand or foot, and: ? You cannot move a finger or toe. ? Your fingers or toes look pale or bluish. Summary  A laceration is a cut that may go through all layers of the skin. The cut may also go into the tissue right under the skin.  Some cuts heal on their own. Others need to be closed with stitches, staples, skin adhesive strips, or skin glue.  Follow your doctor's instructions for caring for your cut. Proper care of a cut lowers the risk of infection, helps the cut heal better, and prevents scarring. This information is not intended to replace advice given to you by your health care provider. Make sure you discuss any questions you have with your health care provider. Document Revised: 04/25/2017 Document Reviewed: 03/17/2017 Elsevier Patient Education  2021 Elsevier Inc[employer]  

## 2020-07-06 NOTE — Telephone Encounter (Signed)
Pt says that [facility] still does not have the rx and wants a call when done

## 2020-07-06 NOTE — Telephone Encounter (Signed)
Rx and office visit notes faxed to [facility] for Diabetic Shoes and pt is aware.

## 2020-07-10 ENCOUNTER — Telehealth: Payer: Self-pay

## 2020-07-11 NOTE — Telephone Encounter (Signed)
Pt states he did pick up his antibiotics and started taking them but his finger is still "thumping" at night and runs a 7 of 10 on the pain scale and requested something to help with the pain at night. Advised pt he would ntbs again for evaluation to make sure the wound is healing properly. Pt scheduled with Tiffany tomorrow at 3:45.

## 2020-07-12 ENCOUNTER — Ambulatory Visit (INDEPENDENT_AMBULATORY_CARE_PROVIDER_SITE_OTHER): Payer: Medicare Other | Admitting: Family Medicine

## 2020-07-12 ENCOUNTER — Other Ambulatory Visit: Payer: Self-pay

## 2020-07-12 ENCOUNTER — Encounter: Payer: Self-pay | Admitting: Family Medicine

## 2020-07-12 VITALS — BP 142/84 | HR 87 | Temp 97.1°F | Ht 66.0 in | Wt 194.0 lb

## 2020-07-12 DIAGNOSIS — S61216D Laceration without foreign body of right little finger without damage to nail, subsequent encounter: Secondary | ICD-10-CM | POA: Diagnosis not present

## 2020-07-12 DIAGNOSIS — M25641 Stiffness of right hand, not elsewhere classified: Secondary | ICD-10-CM | POA: Diagnosis not present

## 2020-07-12 MED ORDER — DICLOFENAC SODIUM 75 MG PO TBEC: 75.0000 mg | DELAYED_RELEASE_TABLET | Freq: Two times a day (BID) | ORAL | 0 refills | Status: DC

## 2020-07-12 NOTE — Progress Notes (Signed)
Acute Office Visit  Subjective:    Patient ID: Anthony Swanson, male    DOB: [birth year], 70 y.o.   MRN: [id number]  Chief Complaint  Patient presents with  . Hand Pain    HPI Patient is in today for hand pain following an injury on 06/29/20 with a wood chipper. He was seen at a [facility] urgent care after and had a normal xray. He has completed clindamycin. He has been keeping the wound clean and dry. He reports continued pain and swelling in this finger. He has limited active ROM but full ROM. The pain is a throbbing pain that is worse at night. He has tried tylenol with little relief. Denies fever or purulent drainage.   Past Medical History:  Diagnosis Date  . Allergy   . Arthritis   . Asthma   . Diabetes mellitus   . Hyperlipidemia   . Hypertension   . Myocardial infarction [facility])     Past Surgical History:  Procedure Laterality Date  . APPENDECTOMY  2020    Family History  Problem Relation Age of Onset  . Heart attack Mother   . Stroke Father   . Cancer Brother   . COPD Brother     Social History   Socioeconomic History  . Marital status: Widowed    Spouse name: Not on file  . Number of children: 3  . Years of education: 8  . Highest education level: 8th grade  Occupational History  . Occupation: retired  Tobacco Use  . Smoking status: Former Smoker    Packs/day: 1.00    Years: 6.00    Pack years: 6.00    Types: Cigarettes    Quit date: 1977    Years since quitting: 45.3  . Smokeless tobacco: Current User    Types: Chew  Vaping Use  . Vaping Use: Never used  Substance and Sexual Activity  . Alcohol use: No  . Drug use: No  . Sexual activity: Not Currently    Birth control/protection: None  Other Topics Concern  . Not on file  Social History Narrative   ** Merged History Encounter **       Social Determinants of Health   Financial Resource Strain: Not on file  Food Insecurity: Not on file  Transportation Needs: Not on file  Physical Activity:  Not on file  Stress: Not on file  Social Connections: Not on file  Intimate Partner Violence: Not on file    Outpatient Medications Prior to Visit  Medication Sig Dispense Refill  . ACCU-CHEK GUIDE test strip Test BS daily Dx E11.9 100 each 3  . Accu-Chek Softclix Lancets lancets Test BS daily Dx E11.9 100 each 3  . acetaminophen (TYLENOL) 500 MG tablet Take 500 mg by mouth every 6 (six) hours as needed. For pain    . albuterol (PROVENTIL) (2.5 MG/3ML) 0.083% nebulizer solution SMARTSIG:1 Vial(s) Via Nebulizer 4 Times Daily PRN    . albuterol (VENTOLIN HFA) 108 (90 Base) MCG/ACT inhaler Inhale 2 puffs into the lungs every 6 (six) hours as needed. For shortness of breath 18 g 5  . Blood Glucose Monitoring Suppl (ACCU-CHEK GUIDE) w/Device KIT Test BS daily Dx E11.9 1 kit 0  . clindamycin (CLEOCIN) 300 MG capsule Take 300 mg by mouth 3 (three) times daily.    . famotidine (PEPCID) 20 MG tablet Take 20 mg by mouth daily.    Marland Kitchen gabapentin (NEURONTIN) 600 MG tablet Take 0.5 tablets (300 mg total) by mouth  at bedtime. 30 tablet 0  . glipiZIDE (GLUCOTROL) 10 MG tablet Take 10 mg by mouth 2 (two) times daily.    . hydrochlorothiazide (HYDRODIURIL) 25 MG tablet Take 25 mg by mouth daily.    Marland Kitchen JARDIANCE 10 MG TABS tablet Take 1 tablet (10 mg total) by mouth daily. 90 tablet 3  . losartan (COZAAR) 50 MG tablet Take 50 mg by mouth daily.    Marland Kitchen omeprazole (PRILOSEC) 40 MG capsule Take 1 capsule (40 mg total) by mouth every morning. 90 capsule 1  . simvastatin (ZOCOR) 20 MG tablet Take 20 mg by mouth every morning.     No facility-administered medications prior to visit.    Allergies  Allergen Reactions  . Codeine   . Penicillins   . Percocet [Oxycodone-Acetaminophen]   . Sulfa Antibiotics     Review of Systems As per HPI.     Objective:    Physical Exam Vitals and nursing note reviewed.  Constitutional:      General: He is not in acute distress.    Appearance: Normal appearance. He is not  ill-appearing, toxic-appearing or diaphoretic.  Pulmonary:     Effort: Pulmonary effort is normal. No respiratory distress.  Musculoskeletal:     Comments: Healing open wound to right dorsal proximal phalanx to little finger. No drainage present. No tenderness, warmth, or surrounding erythema. Generalized swelling to right little finger. Sensation intact. Limited active ROM, full passive ROM.   Skin:    General: Skin is warm and dry.  Neurological:     General: No focal deficit present.     Mental Status: He is alert and oriented to person, place, and time.     BP (!) 142/84   Pulse 87   Temp (!) 97.1 F (36.2 C) (Temporal)   Ht 5' 6"  (1.676 m)   Wt 194 lb (88 kg)   BMI 31.31 kg/m  Wt Readings from Last 3 Encounters:  07/12/20 194 lb (88 kg)  07/03/20 191 lb 2 oz (86.7 kg)  06/15/20 186 lb 6 oz (84.5 kg)    Health Maintenance Due  Topic Date Due  . OPHTHALMOLOGY EXAM  Never done    There are no preventive care reminders to display for this patient.   No results found for: TSH Lab Results  Component Value Date   WBC 8.4 06/15/2020   HGB 16.2 06/15/2020   HCT 49.5 06/15/2020   MCV 91 06/15/2020   PLT 206 06/15/2020   Lab Results  Component Value Date   NA 142 06/15/2020   K 4.7 06/15/2020   CO2 22 06/15/2020   GLUCOSE 156 (H) 06/15/2020   BUN 13 06/15/2020   CREATININE 0.97 06/15/2020   BILITOT 0.3 06/15/2020   ALKPHOS 55 06/15/2020   AST 22 06/15/2020   ALT 22 06/15/2020   PROT 7.0 06/15/2020   ALBUMIN 4.6 06/15/2020   CALCIUM 9.7 06/15/2020   EGFR 84 06/15/2020   Lab Results  Component Value Date   CHOL 147 05/17/2020   Lab Results  Component Value Date   HDL 54 05/17/2020   Lab Results  Component Value Date   LDLCALC 74 05/17/2020   Lab Results  Component Value Date   TRIG 105 05/17/2020   Lab Results  Component Value Date   CHOLHDL 2.7 05/17/2020   Lab Results  Component Value Date   HGBA1C 6.9 05/17/2020       Assessment &  Plan:   Cloys was seen today for hand pain.  Diagnoses and all orders for this visit:  Laceration of right little finger without foreign body, nail damage status unspecified, subsequent encounter Healing. No signs of infection. Has completed clindamycin. Tylenol for pain. Voltaren as need for pain for up to 4 days. Epsom salt soak with warm water are ok. Keep area clean and dry.  -     diclofenac (VOLTAREN) 75 MG EC tablet; Take 1 tablet (75 mg total) by mouth 2 (two) times daily.  Decreased range of motion of finger of right hand Given limited active ROM of little finger follow injury will refer to ortho. Discussed possibility of ligament damage.  -     Ambulatory referral to Orthopedic Surgery  Return to office for new or worsening symptoms, or if symptoms persist.   The patient indicates understanding of these issues and agrees with the plan.  Gwenlyn Perking, FNP

## 2020-07-12 NOTE — Patient Instructions (Signed)
Laceration Care, Adult A laceration is a cut that may go through all layers of the skin and into the tissue that is right under the skin. Some lacerations heal on their own. Others need to be closed with stitches (sutures), staples, skin adhesive strips, or skin glue. Proper care of a laceration reduces the risk for infection, helps the laceration heal better, and may prevent scarring. How to care for your laceration Wash your hands with soap and water before touching your wound or changing your bandage (dressing). If soap and water are not available, use hand sanitizer. Keep the wound clean and dry. If you were given a dressing, you should change it at least once a day, or as told by your health care provider. You should also change it if it becomes wet or dirty. If sutures or staples were used:  Keep the wound completely dry for the first 24 hours, or as told by your health care provider. After that time, you may shower or bathe. However, make sure that the wound is not soaked in water until after the sutures or staples have been removed.  Clean the wound once each day, or as told by your health care provider: ? Wash the wound with soap and water. ? Rinse the wound with water to remove all soap. ? Pat the wound dry with a clean towel. Do not rub the wound.  After cleaning the wound, apply a thin layer of antibiotic ointment as told by your health care provider. This will help prevent infection and keep the dressing from sticking to the wound.  Have the sutures or staples removed as told by your health care provider. If skin adhesive strips were used:  Do not get the skin adhesive strips wet. You may shower or bathe, but be careful to keep the wound dry.  If the wound gets wet, pat it dry with a clean towel. Do not rub the wound.  Skin adhesive strips fall off on their own. You may trim the strips as the wound heals. Do not remove skin adhesive strips that are still stuck to the wound. They  will fall off in time. If skin glue was used:  Try to keep the wound dry, but you may briefly wet it in the shower or bath. Do not soak the wound in water, such as by swimming.  After you have showered or bathed, gently pat the wound dry with a clean towel. Do not rub the wound.  Do not do any activities that will make you sweat heavily until the skin glue has fallen off on its own.  Do not apply liquid, cream, or ointment medicine to the wound while the skin glue is in place. Using those may loosen the film before the wound has healed.  If a dressing is placed over the wound, be careful not to apply tape directly over the skin glue. Doing that may cause the glue to be pulled off before the wound has healed.  Do not pick at the glue. Skin glue usually remains in place for 5-10 days and then falls off the skin. General instructions  Take over-the-counter and prescription medicines only as told by your health care provider.  If you were prescribed an antibiotic medicine or ointment, take or apply it as told by your health care provider. Do not stop using it even if your condition improves.  Do not scratch or pick at the wound.  Check your wound every day for signs of infection.   Watch for: ? Redness, swelling, or pain. ? Fluid, blood, or pus.  Raise (elevate) the injured area above the level of your heart while you are sitting or lying down for the first 24-48 hours after the laceration is repaired.  If directed, put ice on the affected area: ? Put ice in a plastic bag. ? Place a towel between your skin and the bag. ? Leave the ice on for 20 minutes, 2-3 times a day.  Keep all follow-up visits as told by your health care provider. This is important.   Contact a health care provider if:  You received a tetanus shot and you have swelling, severe pain, redness, or bleeding at the injection site.  You have a fever.  A wound that was closed breaks open.  You notice a bad smell  coming from your wound or your dressing.  You notice something coming out of the wound, such as wood or glass.  Your pain is not controlled with medicine.  You have increased redness, swelling, or pain at the site of your wound.  You have fluid, blood, or pus coming from your wound.  You need to change the dressing often due to fluid, blood, or pus that is draining from the wound.  You develop a new rash.  You develop numbness around the wound. Get help right away if:  You develop severe swelling around the wound.  Your pain suddenly increases and is severe.  You develop painful lumps near the wound or on skin anywhere else on your body.  You have a red streak going away from your wound.  The wound is on your hand or foot and you cannot properly move a finger or toe.  The wound is on your hand or foot, and you notice that your fingers or toes look pale or bluish. Summary  A laceration is a cut that may go through all layers of the skin and into the tissue that is right under the skin.  Some lacerations heal on their own. Others need to be closed with stitches (sutures), staples, skin adhesive strips, or skin glue.  Proper care of a laceration reduces the risk of infection, helps the laceration heal better, and prevents scarring. This information is not intended to replace advice given to you by your health care provider. Make sure you discuss any questions you have with your health care provider. Document Revised: 04/25/2017 Document Reviewed: 03/17/2017 Elsevier Patient Education  2021 Elsevier Inc.  

## 2020-07-13 LAB — HM DIABETES EYE EXAM

## 2020-07-14 NOTE — Telephone Encounter (Signed)
Minus Breeding from [facility] in [place] still has not received rx for diabetic shoes. She needs the rx and ov.   ATTENTION JARETTA Please fax to [phone]

## 2020-07-14 NOTE — Telephone Encounter (Signed)
Form received from [facility] pharmacy and was signed and faxed back to [facility].

## 2020-07-19 ENCOUNTER — Ambulatory Visit: Payer: Medicare Other | Admitting: Urology

## 2020-07-19 DIAGNOSIS — J449 Chronic obstructive pulmonary disease, unspecified: Secondary | ICD-10-CM | POA: Diagnosis not present

## 2020-07-20 ENCOUNTER — Ambulatory Visit: Payer: Self-pay

## 2020-07-20 ENCOUNTER — Ambulatory Visit (INDEPENDENT_AMBULATORY_CARE_PROVIDER_SITE_OTHER): Payer: Medicare Other | Admitting: Orthopaedic Surgery

## 2020-07-20 DIAGNOSIS — S6991XA Unspecified injury of right wrist, hand and finger(s), initial encounter: Secondary | ICD-10-CM | POA: Diagnosis not present

## 2020-07-20 DIAGNOSIS — S61216A Laceration without foreign body of right little finger without damage to nail, initial encounter: Secondary | ICD-10-CM

## 2020-07-20 DIAGNOSIS — S61219A Laceration without foreign body of unspecified finger without damage to nail, initial encounter: Secondary | ICD-10-CM | POA: Insufficient documentation

## 2020-07-20 NOTE — Progress Notes (Signed)
Office Visit Note   Patient: Anthony Swanson           Date of Birth: 06/12/1950           MRN: [id number] Visit Date: 07/20/2020              Requested by: Gabriel Earing, FNP [address] [place],  [place] [address] PCP: Gabriel Earing, FNP   Assessment & Plan: Visit Diagnoses:  1. Finger injury, right, initial encounter   2. Laceration of right little finger without foreign body without damage to nail, initial encounter     Plan: Patient can continue with washing with soap and water apply Band-Aid for another week remove it when he is taking bath or shower.  No evidence of cellulitis he is having satisfactory healing and can return on an as-needed basis.  Reviewed x-rays discussed his old proximal phalanx fracture of the small finger.  Return as needed.  Follow-Up Instructions: Return if symptoms worsen or fail to improve.   Orders:  Orders Placed This Encounter  Procedures  . XR Finger Little Right   No orders of the defined types were placed in this encounter.     Procedures: No procedures performed   Clinical Data: No additional findings.   Subjective: Chief Complaint  Patient presents with  . Other    Right little finger injury 07/09/20    HPI 70 year old male seen with right hand small finger injury.  He was running a wood splitter 2 weeks ago and got his finger caught.  Had some superficial lacerations.  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  He had an old comminuted fracture of the proximal phalanx of the fifth finger seen on radiographs today.  Patient's been applying Vaseline washing with soap and water putting a Band-Aid on.  Patient has MP hyperextension PIP flexion with full central slip injury and some PIP contraction.  Review of Systems updated noncontributory to HPI.  Does have some type 2 diabetes.   Objective: Vital Signs: There were no vitals taken  for this visit.  Physical Exam Constitutional:      Appearance: He is well-developed.  HENT:     Head: Normocephalic and atraumatic.  Eyes:     Pupils: Pupils are equal, round, and reactive to light.  Neck:     Thyroid: No thyromegaly.     Trachea: No tracheal deviation.  Cardiovascular:     Rate and Rhythm: Normal rate.  Pulmonary:     Effort: Pulmonary effort is normal.     Breath sounds: No wheezing.  Abdominal:     General: Bowel sounds are normal.     Palpations: Abdomen is soft.  Skin:    General: Skin is warm and dry.     Capillary Refill: Capillary refill takes less than 2 seconds.  Neurological:     Mental Status: He is alert and oriented to person, place, and time.  Psychiatric:        Behavior: Behavior normal.        Thought Content: Thought content normal.        Judgment: Judgment normal.     Ortho Exam amputations to the mid distal phalanx level index long and ring finger.  Patient has lack of extension PIP joint small finger.  There is volar laceration superficial which is healing nicely without cellulitis.  No palpable Dupuytren's.  No triggering of  the digit.  DIP joint is supple but he has problems with extension at DIP joint.  Specialty Comments:  No specialty comments available.  Imaging: XR Finger Little Right  Result Date: 07/20/2020 Three-view x-rays right hand demonstrates old tip amputation to the distal phalanx of the index long and ring finger.  All fracture proximal phalanx small finger healed with 30 degrees extension deformity.  No acute bony injury noted. Impression: Right hand x-rays negative for acute changes chronic changes from previous injury as described above.    PMFS History: Patient Active Problem List   Diagnosis Date Noted  . Finger laceration 07/20/2020  . Type 2 diabetes mellitus without complications (HCC) 05/17/2020  . Asthma 05/17/2020  . Hyperlipidemia associated with type 2 diabetes mellitus (HCC) 05/17/2020  .  Hypertension associated with diabetes (HCC) 05/17/2020  . Myocardial infarction (HCC) 05/17/2020  . Arthritis 05/17/2020  . Allergies 05/17/2020  . Gastroesophageal reflux disease without esophagitis 05/17/2020  . Insomnia 05/17/2020   Past Medical History:  Diagnosis Date  . Allergy   . Arthritis   . Asthma   . Diabetes mellitus   . Hyperlipidemia   . Hypertension   . Myocardial infarction [facility])     Family History  Problem Relation Age of Onset  . Heart attack Mother   . Stroke Father   . Cancer Brother   . COPD Brother     Past Surgical History:  Procedure Laterality Date  . APPENDECTOMY  2020   Social History   Occupational History  . Occupation: retired  Tobacco Use  . Smoking status: Former Smoker    Packs/day: 1.00    Years: 6.00    Pack years: 6.00    Types: Cigarettes    Quit date: 1977    Years since quitting: 45.3  . Smokeless tobacco: Current User    Types: Chew  Vaping Use  . Vaping Use: Never used  Substance and Sexual Activity  . Alcohol use: No  . Drug use: No  . Sexual activity: Not Currently    Birth control/protection: None

## 2020-08-08 ENCOUNTER — Encounter: Payer: Self-pay | Admitting: Physician Assistant

## 2020-08-08 ENCOUNTER — Ambulatory Visit: Payer: Medicare Other | Admitting: *Deleted

## 2020-08-08 ENCOUNTER — Ambulatory Visit (INDEPENDENT_AMBULATORY_CARE_PROVIDER_SITE_OTHER): Payer: Medicare Other | Admitting: Physician Assistant

## 2020-08-08 DIAGNOSIS — J069 Acute upper respiratory infection, unspecified: Secondary | ICD-10-CM | POA: Diagnosis not present

## 2020-08-08 MED ORDER — AZITHROMYCIN 250 MG PO TABS: ORAL_TABLET | ORAL | 0 refills | Status: DC

## 2020-08-08 NOTE — Progress Notes (Signed)
   Virtual Visit  Note Due to COVID-19 pandemic this visit was conducted virtually. This visit type was conducted due to national recommendations for restrictions regarding the COVID-19 Pandemic (e.g. social distancing, sheltering in place) in an effort to limit this patient's exposure and mitigate transmission in our community. All issues noted in this document were discussed and addressed.  A physical exam was not performed with this format.  I connected with Anthony Swanson on 08/08/20 at home by telephone and verified that I am speaking with the correct person using two identifiers. Anthony Swanson is currently located at home and  is currently with no one during visit. The provider, Caren Macadam, PA-C is located in their office at time of visit.  I discussed the limitations, risks, security and privacy concerns of performing an evaluation and management service by telephone and the availability of in person appointments. I also discussed with the patient that there may be a patient responsible charge related to this service. The patient expressed understanding and agreed to proceed.   History and Present Illness:  HPI  Pt with a  3-4 day hx of cough and congestion Hx of sinusitis and URI Also hx of asthma Currently using OTC meds neb tx, and inhalers States BS readings have been normal States ATB are the only things that make it better No COVID hx + vacc hx - Moderna  Review of Systems  Constitutional: Negative for chills, fever and malaise/fatigue.  HENT: Positive for congestion and sinus pain. Negative for nosebleeds and sore throat.   Respiratory: Positive for cough and sputum production. Negative for shortness of breath and wheezing.   Cardiovascular: Negative.      Observations/Objective: Pt with cough during interview No wheezing noted  Assessment and Plan: URI  Follow Up Instructions: [facility] rx since recent on Clindamycin Fluids Inhaler/Neb use prn F/U prn    I  discussed the assessment and treatment plan with the patient. The patient was provided an opportunity to ask questions and all were answered. The patient agreed with the plan and demonstrated an understanding of the instructions.   The patient was advised to call back or seek an in-person evaluation if the symptoms worsen or if the condition fails to improve as anticipated.  The above assessment and management plan was discussed with the patient. The patient verbalized understanding of and has agreed to the management plan. Patient is aware to call the clinic if symptoms persist or worsen. Patient is aware when to return to the clinic for a follow-up visit. Patient educated on when it is appropriate to go to the emergency department.   Time call ended:    I provided of  non face-to-face time during this encounter.    Caren Macadam, PA-C

## 2020-08-08 NOTE — Patient Instructions (Signed)

## 2020-08-10 ENCOUNTER — Telehealth: Payer: Self-pay | Admitting: Family Medicine

## 2020-08-10 DIAGNOSIS — J069 Acute upper respiratory infection, unspecified: Secondary | ICD-10-CM

## 2020-08-10 MED ORDER — GUAIFENESIN ER 600 MG PO TB12: 600.0000 mg | ORAL_TABLET | Freq: Two times a day (BID) | ORAL | 0 refills | Status: DC | PRN

## 2020-08-10 NOTE — Addendum Note (Signed)
Addended by: Gabriel Earing on: 08/10/2020 09:38 AM   Modules accepted: Orders

## 2020-08-10 NOTE — Telephone Encounter (Signed)
Patient was prescribed Zpak on 08/08/20 and is still experiencing cough and congestion and wants something stronger sent in to [facility].  I explained to patient that I felt he should give it more time since he has only been taking for two days but patient asked that I send you a message and get your opinion.

## 2020-08-10 NOTE — Telephone Encounter (Signed)
He can use an OTC nasal saline spray.

## 2020-08-10 NOTE — Telephone Encounter (Signed)
Yes, he should give the Zpak more time. I did send in some Mucinex for him to take to help with congestion and cough.

## 2020-08-12 ENCOUNTER — Other Ambulatory Visit: Payer: Self-pay | Admitting: Family Medicine

## 2020-08-12 DIAGNOSIS — E1159 Type 2 diabetes mellitus with other circulatory complications: Secondary | ICD-10-CM

## 2020-08-14 ENCOUNTER — Other Ambulatory Visit: Payer: Self-pay | Admitting: Family Medicine

## 2020-08-14 ENCOUNTER — Encounter: Payer: Self-pay | Admitting: Family Medicine

## 2020-08-14 DIAGNOSIS — E119 Type 2 diabetes mellitus without complications: Secondary | ICD-10-CM

## 2020-08-17 ENCOUNTER — Encounter: Payer: Self-pay | Admitting: Family Medicine

## 2020-08-17 ENCOUNTER — Ambulatory Visit (INDEPENDENT_AMBULATORY_CARE_PROVIDER_SITE_OTHER): Payer: Medicare Other | Admitting: Family Medicine

## 2020-08-17 ENCOUNTER — Other Ambulatory Visit: Payer: Self-pay

## 2020-08-17 VITALS — BP 145/78 | HR 74 | Temp 97.0°F | Ht 66.0 in | Wt 196.4 lb

## 2020-08-17 DIAGNOSIS — E1159 Type 2 diabetes mellitus with other circulatory complications: Secondary | ICD-10-CM

## 2020-08-17 DIAGNOSIS — F5104 Psychophysiologic insomnia: Secondary | ICD-10-CM

## 2020-08-17 DIAGNOSIS — E785 Hyperlipidemia, unspecified: Secondary | ICD-10-CM

## 2020-08-17 DIAGNOSIS — K219 Gastro-esophageal reflux disease without esophagitis: Secondary | ICD-10-CM

## 2020-08-17 DIAGNOSIS — I152 Hypertension secondary to endocrine disorders: Secondary | ICD-10-CM

## 2020-08-17 DIAGNOSIS — E1169 Type 2 diabetes mellitus with other specified complication: Secondary | ICD-10-CM

## 2020-08-17 DIAGNOSIS — E1140 Type 2 diabetes mellitus with diabetic neuropathy, unspecified: Secondary | ICD-10-CM

## 2020-08-17 DIAGNOSIS — E119 Type 2 diabetes mellitus without complications: Secondary | ICD-10-CM | POA: Diagnosis not present

## 2020-08-17 LAB — BAYER DCA HB A1C WAIVED: HB A1C (BAYER DCA - WAIVED): 7.3 % — ABNORMAL HIGH (ref <7.0)

## 2020-08-17 MED ORDER — HYDROCHLOROTHIAZIDE 25 MG PO TABS: 25.0000 mg | ORAL_TABLET | Freq: Every day | ORAL | 3 refills | Status: DC

## 2020-08-17 NOTE — Patient Instructions (Signed)
Diabetes Mellitus and Nutrition, Adult When you have diabetes, or diabetes mellitus, it is very important to have healthy eating habits because your blood sugar (glucose) levels are greatly affected by what you eat and drink. Eating healthy foods in the right amounts, at about the same times every day, can help you:  Control your blood glucose.  Lower your risk of heart disease.  Improve your blood pressure.  Reach or maintain a healthy weight. What can affect my meal plan? Every person with diabetes is different, and each person has different needs for a meal plan. Your health care provider may recommend that you work with a dietitian to make a meal plan that is best for you. Your meal plan may vary depending on factors such as:  The calories you need.  The medicines you take.  Your weight.  Your blood glucose, blood pressure, and cholesterol levels.  Your activity level.  Other health conditions you have, such as heart or kidney disease. How do carbohydrates affect me? Carbohydrates, also called carbs, affect your blood glucose level more than any other type of food. Eating carbs naturally raises the amount of glucose in your blood. Carb counting is a method for keeping track of how many carbs you eat. Counting carbs is important to keep your blood glucose at a healthy level, especially if you use insulin or take certain oral diabetes medicines. It is important to know how many carbs you can safely have in each meal. This is different for every person. Your dietitian can help you calculate how many carbs you should have at each meal and for each snack. How does alcohol affect me? Alcohol can cause a sudden decrease in blood glucose (hypoglycemia), especially if you use insulin or take certain oral diabetes medicines. Hypoglycemia can be a life-threatening condition. Symptoms of hypoglycemia, such as sleepiness, dizziness, and confusion, are similar to symptoms of having too much  alcohol.  Do not drink alcohol if: ? Your health care provider tells you not to drink. ? You are pregnant, may be pregnant, or are planning to become pregnant.  If you drink alcohol: ? Do not drink on an empty stomach. ? Limit how much you use to:  0-1 drink a day for women.  0-2 drinks a day for men. ? Be aware of how much alcohol is in your drink. In the [place] one drink equals one 12 oz bottle of beer (355 mL), one 5 oz glass of wine (148 mL), or one 1 oz glass of hard liquor (44 mL). ? Keep yourself hydrated with water, diet soda, or unsweetened iced tea.  Keep in mind that regular soda, juice, and other mixers may contain a lot of sugar and must be counted as carbs. What are tips for following this plan? Reading food labels  Start by checking the serving size on the "Nutrition Facts" label of packaged foods and drinks. The amount of calories, carbs, fats, and other nutrients listed on the label is based on one serving of the item. Many items contain more than one serving per package.  Check the total grams (g) of carbs in one serving. You can calculate the number of servings of carbs in one serving by dividing the total carbs by 15. For example, if a food has 30 g of total carbs per serving, it would be equal to 2 servings of carbs.  Check the number of grams (g) of saturated fats and trans fats in one serving. Choose foods that have   a low amount or none of these fats.  Check the number of milligrams (mg) of salt (sodium) in one serving. Most people should limit total sodium intake to less than 2,300 mg per day.  Always check the nutrition information of foods labeled as "low-fat" or "nonfat." These foods may be higher in added sugar or refined carbs and should be avoided.  Talk to your dietitian to identify your daily goals for nutrients listed on the label. Shopping  Avoid buying canned, pre-made, or processed foods. These foods tend to be high in fat, sodium, and added  sugar.  Shop around the outside edge of the grocery store. This is where you will most often find fresh fruits and vegetables, bulk grains, fresh meats, and fresh dairy. Cooking  Use low-heat cooking methods, such as baking, instead of high-heat cooking methods like deep frying.  Cook using healthy oils, such as olive, canola, or sunflower oil.  Avoid cooking with butter, cream, or high-fat meats. Meal planning  Eat meals and snacks regularly, preferably at the same times every day. Avoid going long periods of time without eating.  Eat foods that are high in fiber, such as fresh fruits, vegetables, beans, and whole grains. Talk with your dietitian about how many servings of carbs you can eat at each meal.  Eat 4-6 oz (112-168 g) of lean protein each day, such as lean meat, chicken, fish, eggs, or tofu. One ounce (oz) of lean protein is equal to: ? 1 oz (28 g) of meat, chicken, or fish. ? 1 egg. ?  cup (62 g) of tofu.  Eat some foods each day that contain healthy fats, such as avocado, nuts, seeds, and fish.   What foods should I eat? Fruits Berries. Apples. Oranges. Peaches. Apricots. Plums. Grapes. Mango. Papaya. Pomegranate. Kiwi. Cherries. Vegetables Lettuce. Spinach. Leafy greens, including kale, chard, collard greens, and mustard greens. Beets. Cauliflower. Cabbage. Broccoli. Carrots. Green beans. Tomatoes. Peppers. Onions. Cucumbers. Brussels sprouts. Grains Whole grains, such as whole-wheat or whole-grain bread, crackers, tortillas, cereal, and pasta. Unsweetened oatmeal. Quinoa. Brown or wild rice. Meats and other proteins Seafood. Poultry without skin. Lean cuts of poultry and beef. Tofu. Nuts. Seeds. Dairy Low-fat or fat-free dairy products such as milk, yogurt, and cheese. The items listed above may not be a complete list of foods and beverages you can eat. Contact a dietitian for more information. What foods should I avoid? Fruits Fruits canned with  syrup. Vegetables Canned vegetables. Frozen vegetables with butter or cream sauce. Grains Refined white flour and flour products such as bread, pasta, snack foods, and cereals. Avoid all processed foods. Meats and other proteins Fatty cuts of meat. Poultry with skin. Breaded or fried meats. Processed meat. Avoid saturated fats. Dairy Full-fat yogurt, cheese, or milk. Beverages Sweetened drinks, such as soda or iced tea. The items listed above may not be a complete list of foods and beverages you should avoid. Contact a dietitian for more information. Questions to ask a health care provider  Do I need to meet with a diabetes educator?  Do I need to meet with a dietitian?  What number can I call if I have questions?  When are the best times to check my blood glucose? Where to find more information:  American Diabetes Association: diabetes.org  Academy of Nutrition and Dietetics: www.eatright.org  National Institute of Diabetes and Digestive and Kidney Diseases: www.niddk.nih.gov  Association of Diabetes Care and Education Specialists: www.diabeteseducator.org Summary  It is important to have healthy eating   habits because your blood sugar (glucose) levels are greatly affected by what you eat and drink.  A healthy meal plan will help you control your blood glucose and maintain a healthy lifestyle.  Your health care provider may recommend that you work with a dietitian to make a meal plan that is best for you.  Keep in mind that carbohydrates (carbs) and alcohol have immediate effects on your blood glucose levels. It is important to count carbs and to use alcohol carefully. This information is not intended to replace advice given to you by your health care provider. Make sure you discuss any questions you have with your health care provider. Document Revised: 02/02/2019 Document Reviewed: 02/02/2019 Elsevier Patient Education  2021 Elsevier Inc.  

## 2020-08-17 NOTE — Progress Notes (Signed)
New Patient Office Visit  Subjective:  Patient ID: Anthony Swanson, male    DOB: [birth year]  Age: 70 y.o. MRN: [id number]  CC:  Chief Complaint  Patient presents with   Medical Management of Chronic Issues    HPI Anthony Swanson presents for chronic follow up.  DM Patient denies foot ulcerations, hyperglycemia, increased appetite, paresthesia of the feet, polydipsia, visual disturbances, vomiting and weight loss.   Current diabetic medications include: glipizide, jardiance Compliant with meds - Yes   Current monitoring regimen: home blood tests - not recently Any episodes of hypoglycemia? no   Eye exam current (within one year): going in July Is He on ACE inhibitor or angiotensin II receptor blocker? Yes Is He on statin? Yes   2. HTN Complaint with meds - Yes Current Medications - losartan. He has not been taking his HCTZ. He hasn't had any at home.   He walks daily for exercise. He tries to eat low salt.   Pertinent ROS: Headache - No Fatigue - No Visual Disturbances - No Chest pain - No Dyspnea - No Palpitations - No LE edema - No They report good compliance with medications and can restate their regimen by memory. No medication side effects.  BP Readings from Last 3 Encounters:  08/17/20 (!) 145/78  07/12/20 (!) 142/84  07/03/20 129/69       Past Medical History:  Diagnosis Date   Allergy    Arthritis    Asthma    Diabetes mellitus    Hyperlipidemia    Hypertension    Myocardial infarction [facility])     Past Surgical History:  Procedure Laterality Date   APPENDECTOMY  2020    Family History  Problem Relation Age of Onset   Heart attack Mother    Stroke Father    Cancer Brother    COPD Brother     Social History   Socioeconomic History   Marital status: Widowed    Spouse name: Not on file   Number of children: 3   Years of education: 8   Highest education level: 8th grade  Occupational History   Occupation: retired  Tobacco Use    Smoking status: Former    Packs/day: 1.00    Years: 6.00    Pack years: 6.00    Types: Cigarettes    Quit date: 1977    Years since quitting: 45.4   Smokeless tobacco: Current    Types: Nurse, children's Use: Never used  Substance and Sexual Activity   Alcohol use: No   Drug use: No   Sexual activity: Not Currently    Birth control/protection: None  Other Topics Concern   Not on file  Social History Narrative   ** Merged History Encounter **       Social Determinants of Health   Financial Resource Strain: Not on file  Food Insecurity: Not on file  Transportation Needs: Not on file  Physical Activity: Not on file  Stress: Not on file  Social Connections: Not on file  Intimate Partner Violence: Not on file    ROS As per HPI.   Objective:   Today's Vitals: BP (!) 145/78   Pulse 74   Temp (!) 97 F (36.1 C) (Oral)   Ht 5' 6"  (1.676 m)   Wt 196 lb 6 oz (89.1 kg)   BMI 31.70 kg/m   Physical Exam Vitals and nursing note reviewed.  Constitutional:      General:  He is not in acute distress.    Appearance: He is not ill-appearing, toxic-appearing or diaphoretic.  Eyes:     Pupils: Pupils are equal, round, and reactive to light.  Cardiovascular:     Rate and Rhythm: Normal rate and regular rhythm.     Heart sounds: Normal heart sounds. No murmur heard. Pulmonary:     Effort: Pulmonary effort is normal. No respiratory distress.     Breath sounds: Normal breath sounds.  Abdominal:     General: Bowel sounds are normal. There is no distension.     Palpations: Abdomen is soft.     Tenderness: There is no abdominal tenderness. There is no guarding or rebound.  Musculoskeletal:     Right lower leg: No edema.     Left lower leg: No edema.  Skin:    General: Skin is warm and dry.  Neurological:     General: No focal deficit present.     Mental Status: He is alert and oriented to person, place, and time.  Psychiatric:        Mood and Affect: Mood normal.         Behavior: Behavior normal.    Assessment & Plan:   Anthony Swanson was seen today for medical management of chronic issues.  Diagnoses and all orders for this visit:  Type 2 diabetes mellitus with diabetic neuropathy, without long-term current use of insulin (HCC) A1c 7.3 today. At goal of <7.5. No changes in medications today. Labs pending as below. -     CBC with Differential/Platelet -     CMP14+EGFR -     Bayer DCA Hb A1c Waived  Hyperlipidemia associated with type 2 diabetes mellitus ([place]) Well controlled on current regimen.  -     CBC with Differential/Platelet -     CMP14+EGFR  Gastroesophageal reflux disease without esophagitis Well controlled on current regimen.  -     CBC with Differential/Platelet -     CMP14+EGFR  Hypertension associated with diabetes ([place]) BP slightly elevated today. Patient has not been taking HCTZ. Will restart HCTZ.  -     CBC with Differential/Platelet -     CMP14+EGFR       -     hydrochlorothiazide (HYDRODIURIL) 25 MG tablet;  Take 1 tablet (25 mg total) by mouth daily.  Chronic insomnia Well controlled on current regimen.    Follow-up: Return in about 3 months (around 11/17/2020) for chronic follow up.  The patient indicates understanding of these issues and agrees with the plan.   Gwenlyn Perking, FNP

## 2020-08-18 LAB — CMP14+EGFR
ALT: 17 IU/L (ref 0–44)
AST: 13 IU/L (ref 0–40)
Albumin/Globulin Ratio: 1.7 (ref 1.2–2.2)
Albumin: 4.1 g/dL (ref 3.8–4.8)
Alkaline Phosphatase: 53 IU/L (ref 44–121)
BUN/Creatinine Ratio: 9 — ABNORMAL LOW (ref 10–24)
BUN: 11 mg/dL (ref 8–27)
Bilirubin Total: 0.2 mg/dL (ref 0.0–1.2)
CO2: 18 mmol/L — ABNORMAL LOW (ref 20–29)
Calcium: 9.2 mg/dL (ref 8.6–10.2)
Chloride: 106 mmol/L (ref 96–106)
Creatinine, Ser: 1.24 mg/dL (ref 0.76–1.27)
Globulin, Total: 2.4 g/dL (ref 1.5–4.5)
Glucose: 199 mg/dL — ABNORMAL HIGH (ref 65–99)
Potassium: 5 mmol/L (ref 3.5–5.2)
Sodium: 141 mmol/L (ref 134–144)
Total Protein: 6.5 g/dL (ref 6.0–8.5)
eGFR: 63 mL/min/{1.73_m2} (ref >59)

## 2020-08-18 LAB — CBC WITH DIFFERENTIAL/PLATELET
Basophils Absolute: 0 10*3/uL (ref 0.0–0.2)
Basos: 1 %
EOS (ABSOLUTE): 0.1 10*3/uL (ref 0.0–0.4)
Eos: 2 %
Hematocrit: 46.1 % (ref 37.5–51.0)
Hemoglobin: 15.1 g/dL (ref 13.0–17.7)
Immature Grans (Abs): 0.1 10*3/uL (ref 0.0–0.1)
Immature Granulocytes: 1 %
Lymphocytes Absolute: 1.5 10*3/uL (ref 0.7–3.1)
Lymphs: 21 %
MCH: 30 pg (ref 26.6–33.0)
MCHC: 32.8 g/dL (ref 31.5–35.7)
MCV: 92 fL (ref 79–97)
Monocytes Absolute: 0.5 10*3/uL (ref 0.1–0.9)
Monocytes: 8 %
Neutrophils Absolute: 4.8 10*3/uL (ref 1.4–7.0)
Neutrophils: 67 %
Platelets: 220 10*3/uL (ref 150–450)
RBC: 5.04 x10E6/uL (ref 4.14–5.80)
RDW: 13.1 % (ref 11.6–15.4)
WBC: 7.1 10*3/uL (ref 3.4–10.8)

## 2020-08-19 DIAGNOSIS — J449 Chronic obstructive pulmonary disease, unspecified: Secondary | ICD-10-CM | POA: Diagnosis not present

## 2020-08-21 NOTE — Progress Notes (Signed)
Lmtcb.

## 2020-09-01 ENCOUNTER — Telehealth: Payer: Self-pay | Admitting: Family Medicine

## 2020-09-01 DIAGNOSIS — J4540 Moderate persistent asthma, uncomplicated: Secondary | ICD-10-CM

## 2020-09-01 MED ORDER — ALBUTEROL SULFATE HFA 108 (90 BASE) MCG/ACT IN AERS: 2.0000 | INHALATION_SPRAY | Freq: Four times a day (QID) | RESPIRATORY_TRACT | 5 refills | Status: DC | PRN

## 2020-09-01 NOTE — Telephone Encounter (Signed)
  Prescription Request  09/01/2020  What is the name of the medication or equipment? albuterol (VENTOLIN HFA) 108 (90 Base) MCG/ACT inhaler  Have you contacted your pharmacy to request a refill? (if applicable) yes  Which pharmacy would you like this sent to? [facility]   Patient notified that their request is being sent to the clinical staff for review and that they should receive a response within 2 business days.

## 2020-09-01 NOTE — Telephone Encounter (Signed)
sent 

## 2020-09-04 ENCOUNTER — Other Ambulatory Visit: Payer: Self-pay | Admitting: Family Medicine

## 2020-09-04 DIAGNOSIS — M79604 Pain in right leg: Secondary | ICD-10-CM

## 2020-09-04 DIAGNOSIS — E119 Type 2 diabetes mellitus without complications: Secondary | ICD-10-CM

## 2020-09-04 DIAGNOSIS — I152 Hypertension secondary to endocrine disorders: Secondary | ICD-10-CM

## 2020-09-12 ENCOUNTER — Telehealth: Payer: Self-pay | Admitting: Family Medicine

## 2020-09-12 NOTE — Telephone Encounter (Signed)
  Prescription Request  09/12/2020  What is the name of the medication or equipment? losartan  Have you contacted your pharmacy to request a refill? (if applicable) yes  Which pharmacy would you like this sent to? [facility]    Patient notified that their request is being sent to the clinical staff for review and that they should receive a response within 2 business days.

## 2020-09-18 DIAGNOSIS — J449 Chronic obstructive pulmonary disease, unspecified: Secondary | ICD-10-CM | POA: Diagnosis not present

## 2020-09-19 ENCOUNTER — Other Ambulatory Visit: Payer: Self-pay | Admitting: Family Medicine

## 2020-09-19 DIAGNOSIS — K219 Gastro-esophageal reflux disease without esophagitis: Secondary | ICD-10-CM

## 2020-09-25 ENCOUNTER — Ambulatory Visit: Payer: Medicare Other

## 2020-10-04 ENCOUNTER — Other Ambulatory Visit: Payer: Self-pay | Admitting: Family Medicine

## 2020-10-04 DIAGNOSIS — E1169 Type 2 diabetes mellitus with other specified complication: Secondary | ICD-10-CM

## 2020-10-09 ENCOUNTER — Ambulatory Visit: Payer: Medicare Other | Admitting: Family Medicine

## 2020-10-12 ENCOUNTER — Encounter: Payer: Self-pay | Admitting: Family Medicine

## 2020-10-19 DIAGNOSIS — J449 Chronic obstructive pulmonary disease, unspecified: Secondary | ICD-10-CM | POA: Diagnosis not present

## 2020-10-23 ENCOUNTER — Other Ambulatory Visit: Payer: Self-pay

## 2020-10-23 ENCOUNTER — Encounter: Payer: Self-pay | Admitting: Family Medicine

## 2020-10-23 ENCOUNTER — Ambulatory Visit (INDEPENDENT_AMBULATORY_CARE_PROVIDER_SITE_OTHER): Payer: Medicare Other | Admitting: Family Medicine

## 2020-10-23 VITALS — BP 133/76 | HR 91 | Temp 98.4°F | Ht 66.0 in | Wt 204.0 lb

## 2020-10-23 DIAGNOSIS — R8290 Unspecified abnormal findings in urine: Secondary | ICD-10-CM

## 2020-10-23 DIAGNOSIS — R109 Unspecified abdominal pain: Secondary | ICD-10-CM | POA: Diagnosis not present

## 2020-10-23 DIAGNOSIS — G8929 Other chronic pain: Secondary | ICD-10-CM | POA: Diagnosis not present

## 2020-10-23 DIAGNOSIS — M545 Low back pain, unspecified: Secondary | ICD-10-CM | POA: Diagnosis not present

## 2020-10-23 LAB — URINALYSIS, ROUTINE W REFLEX MICROSCOPIC
Bilirubin, UA: NEGATIVE
Ketones, UA: NEGATIVE
Leukocytes,UA: NEGATIVE
Nitrite, UA: NEGATIVE
Protein,UA: NEGATIVE
Specific Gravity, UA: 1.01 (ref 1.005–1.030)
Urobilinogen, Ur: 0.2 mg/dL (ref 0.2–1.0)
pH, UA: 6.5 (ref 5.0–7.5)

## 2020-10-23 LAB — MICROSCOPIC EXAMINATION
Bacteria, UA: NONE SEEN
Epithelial Cells (non renal): NONE SEEN /hpf (ref 0–10)
RBC, Urine: NONE SEEN /hpf (ref 0–2)
Renal Epithel, UA: NONE SEEN /hpf
WBC, UA: NONE SEEN /hpf (ref 0–5)

## 2020-10-23 MED ORDER — LIDOCAINE 5 % EX OINT: 1.0000 "application " | TOPICAL_OINTMENT | CUTANEOUS | 1 refills | Status: DC | PRN

## 2020-10-23 NOTE — Patient Instructions (Signed)

## 2020-10-23 NOTE — Progress Notes (Signed)
Acute Office Visit  Subjective:    Patient ID: Anthony Swanson, male    DOB: 02-21-1951, 70 y.o.   MRN: [id number]  Chief Complaint  Patient presents with   Back Pain    HPI Patient is in today for lower back pain x 1 week. He reports a history of chronic back pain. The pain has been worse for the last week. It is in the middle of his lower back. He also reports some urinary odor and darker color to his urine. Denies injury, saddle anesthesia, dysuria, urgency, frequency, erythema, swelling, fever, nausea, or fever. He has been taking tylenol without much help.   Past Medical History:  Diagnosis Date   Allergy    Arthritis    Asthma    Diabetes mellitus    Hyperlipidemia    Hypertension    Myocardial infarction [facility])     Past Surgical History:  Procedure Laterality Date   APPENDECTOMY  2020    Family History  Problem Relation Age of Onset   Heart attack Mother    Stroke Father    Cancer Brother    COPD Brother     Social History   Socioeconomic History   Marital status: Widowed    Spouse name: Not on file   Number of children: 3   Years of education: 8   Highest education level: 8th grade  Occupational History   Occupation: retired  Tobacco Use   Smoking status: Former    Packs/day: 1.00    Years: 6.00    Pack years: 6.00    Types: Cigarettes    Quit date: 1977    Years since quitting: 45.6   Smokeless tobacco: Current    Types: Nurse, children's Use: Never used  Substance and Sexual Activity   Alcohol use: No   Drug use: No   Sexual activity: Not Currently    Birth control/protection: None  Other Topics Concern   Not on file  Social History Narrative   ** Merged History Encounter **       Social Determinants of Health   Financial Resource Strain: Not on file  Food Insecurity: Not on file  Transportation Needs: Not on file  Physical Activity: Not on file  Stress: Not on file  Social Connections: Not on file  Intimate Partner  Violence: Not on file    Outpatient Medications Prior to Visit  Medication Sig Dispense Refill   ACCU-CHEK GUIDE test strip Test BS daily Dx E11.9 100 each 3   Accu-Chek Softclix Lancets lancets Test BS daily Dx E11.9 100 each 3   acetaminophen (TYLENOL) 500 MG tablet Take 500 mg by mouth every 6 (six) hours as needed. For pain     albuterol (PROVENTIL) (2.5 MG/3ML) 0.083% nebulizer solution SMARTSIG:1 Vial(s) Via Nebulizer 4 Times Daily PRN     albuterol (VENTOLIN HFA) 108 (90 Base) MCG/ACT inhaler Inhale 2 puffs into the lungs every 6 (six) hours as needed. For shortness of breath 18 g 5   Blood Glucose Monitoring Suppl (ACCU-CHEK GUIDE) w/Device KIT Test BS daily Dx E11.9 1 kit 0   diclofenac (VOLTAREN) 75 MG EC tablet Take 1 tablet (75 mg total) by mouth 2 (two) times daily. 8 tablet 0   famotidine (PEPCID) 20 MG tablet TAKE 1 TABLET BY MOUTH EVERY DAY AFTER dinner 90 tablet 0   gabapentin (NEURONTIN) 600 MG tablet TAKE 1/2 TABLET BY MOUTH AT BEDTIME 45 tablet 0   glipiZIDE (GLUCOTROL)  10 MG tablet TAKE 1 TABLET BY MOUTH TWICE DAILY 60 tablet 2   hydrochlorothiazide (HYDRODIURIL) 25 MG tablet Take 1 tablet (25 mg total) by mouth daily. 90 tablet 3   JARDIANCE 10 MG TABS tablet Take 1 tablet (10 mg total) by mouth daily. 90 tablet 3   losartan (COZAAR) 50 MG tablet TAKE 1 TABLET BY MOUTH EVERY DAY 30 tablet 5   omeprazole (PRILOSEC) 40 MG capsule Take 1 capsule (40 mg total) by mouth every morning. 90 capsule 1   simvastatin (ZOCOR) 20 MG tablet TAKE 1 TABLET BY MOUTH EVERY MORNING 90 tablet 0   traZODone (DESYREL) 50 MG tablet Take 25-50 mg by mouth at bedtime as needed.     No facility-administered medications prior to visit.    Allergies  Allergen Reactions   Codeine    Penicillins    Percocet [Oxycodone-Acetaminophen]    Sulfa Antibiotics     Review of Systems As per HPI.     Objective:    Physical Exam Vitals and nursing note reviewed.  Constitutional:      General:  He is not in acute distress.    Appearance: He is not ill-appearing, toxic-appearing or diaphoretic.  Cardiovascular:     Rate and Rhythm: Normal rate and regular rhythm.     Heart sounds: Normal heart sounds. No murmur heard. Pulmonary:     Effort: Pulmonary effort is normal. No respiratory distress.     Breath sounds: Normal breath sounds.  Abdominal:     Tenderness: There is no abdominal tenderness. There is no right CVA tenderness, left CVA tenderness, guarding or rebound.  Musculoskeletal:     Lumbar back: No swelling, edema, deformity, tenderness or bony tenderness. Negative right straight leg raise test and negative left straight leg raise test.     Right lower leg: No edema.     Left lower leg: No edema.  Skin:    General: Skin is warm and dry.  Neurological:     General: No focal deficit present.     Mental Status: He is alert and oriented to person, place, and time.  Psychiatric:        Mood and Affect: Mood normal.        Behavior: Behavior normal.    BP 133/76   Pulse 91   Temp 98.4 F (36.9 C) (Temporal)   Ht _0  (1.676 m)   Wt 204 lb (92.5 kg)   BMI 32.93 kg/m  Wt Readings from Last 3 Encounters:  10/23/20 204 lb (92.5 kg)  08/17/20 196 lb 6 oz (89.1 kg)  07/12/20 194 lb (88 kg)   Urine dipstick shows positive for RBC's and positive for glucose.  Micro exam: negative for WBC's or RBC's, negative for bacteria   Health Maintenance Due  Topic Date Due   OPHTHALMOLOGY EXAM  Never done    There are no preventive care reminders to display for this patient.   No results found for: TSH Lab Results  Component Value Date   WBC 7.1 08/17/2020   HGB 15.1 08/17/2020   HCT 46.1 08/17/2020   MCV 92 08/17/2020   PLT 220 08/17/2020   Lab Results  Component Value Date   NA 141 08/17/2020   K 5.0 08/17/2020   CO2 18 (L) 08/17/2020   GLUCOSE 199 (H) 08/17/2020   BUN 11 08/17/2020   CREATININE 1.24 08/17/2020   BILITOT 0.2 08/17/2020   ALKPHOS 53  08/17/2020   AST 13 08/17/2020   ALT  17 08/17/2020   PROT 6.5 08/17/2020   ALBUMIN 4.1 08/17/2020   CALCIUM 9.2 08/17/2020   EGFR 63 08/17/2020   Lab Results  Component Value Date   CHOL 147 05/17/2020   Lab Results  Component Value Date   HDL 54 05/17/2020   Lab Results  Component Value Date   LDLCALC 74 05/17/2020   Lab Results  Component Value Date   TRIG 105 05/17/2020   Lab Results  Component Value Date   CHOLHDL 2.7 05/17/2020   Lab Results  Component Value Date   HGBA1C 7.3 (H) 08/17/2020       Assessment & Plan:   Jevin was seen today for back pain.  Diagnoses and all orders for this visit:  Bad odor of urine Negative UA. Discussed hydration.  -     Urinalysis, Routine w reflex microscopic -     Microscopic Examination  Chronic midline low back pain without sciatica Worsening for last week. Benign exam, no alarm signs. Declined steroid IM injection today. Lidocaine ointment as below.  -     lidocaine (XYLOCAINE) 5 % ointment; Apply 1 application topically as needed.  Return to office for new or worsening symptoms, or if symptoms persist.   The patient indicates understanding of these issues and agrees with the plan.  Gwenlyn Perking, FNP

## 2020-10-29 ENCOUNTER — Other Ambulatory Visit: Payer: Self-pay | Admitting: Family Medicine

## 2020-10-29 DIAGNOSIS — F5104 Psychophysiologic insomnia: Secondary | ICD-10-CM

## 2020-11-08 ENCOUNTER — Ambulatory Visit: Payer: Medicare Other | Admitting: Family Medicine

## 2020-11-17 ENCOUNTER — Ambulatory Visit: Payer: Medicare Other | Admitting: Family Medicine

## 2020-11-19 DIAGNOSIS — J449 Chronic obstructive pulmonary disease, unspecified: Secondary | ICD-10-CM | POA: Diagnosis not present

## 2020-11-27 ENCOUNTER — Ambulatory Visit: Payer: Medicare Other | Admitting: Family Medicine

## 2020-11-27 ENCOUNTER — Other Ambulatory Visit: Payer: Self-pay | Admitting: Family Medicine

## 2020-11-27 DIAGNOSIS — M79604 Pain in right leg: Secondary | ICD-10-CM

## 2020-11-27 DIAGNOSIS — E119 Type 2 diabetes mellitus without complications: Secondary | ICD-10-CM

## 2020-12-07 ENCOUNTER — Ambulatory Visit (INDEPENDENT_AMBULATORY_CARE_PROVIDER_SITE_OTHER): Payer: Medicare Other | Admitting: Family Medicine

## 2020-12-07 ENCOUNTER — Encounter: Payer: Self-pay | Admitting: Family Medicine

## 2020-12-07 ENCOUNTER — Other Ambulatory Visit: Payer: Self-pay

## 2020-12-07 VITALS — BP 120/74 | HR 87 | Temp 97.9°F | Ht 66.0 in | Wt 200.0 lb

## 2020-12-07 DIAGNOSIS — E785 Hyperlipidemia, unspecified: Secondary | ICD-10-CM | POA: Diagnosis not present

## 2020-12-07 DIAGNOSIS — E1169 Type 2 diabetes mellitus with other specified complication: Secondary | ICD-10-CM | POA: Diagnosis not present

## 2020-12-07 DIAGNOSIS — E1140 Type 2 diabetes mellitus with diabetic neuropathy, unspecified: Secondary | ICD-10-CM

## 2020-12-07 DIAGNOSIS — K219 Gastro-esophageal reflux disease without esophagitis: Secondary | ICD-10-CM

## 2020-12-07 DIAGNOSIS — E1159 Type 2 diabetes mellitus with other circulatory complications: Secondary | ICD-10-CM | POA: Diagnosis not present

## 2020-12-07 DIAGNOSIS — I152 Hypertension secondary to endocrine disorders: Secondary | ICD-10-CM

## 2020-12-07 LAB — BAYER DCA HB A1C WAIVED: HB A1C (BAYER DCA - WAIVED): 8.2 % — ABNORMAL HIGH (ref 4.8–5.6)

## 2020-12-07 MED ORDER — GABAPENTIN 600 MG PO TABS: 600.0000 mg | ORAL_TABLET | Freq: Every day | ORAL | 0 refills | Status: DC

## 2020-12-07 MED ORDER — EMPAGLIFLOZIN 25 MG PO TABS: 25.0000 mg | ORAL_TABLET | Freq: Every day | ORAL | 1 refills | Status: DC

## 2020-12-07 NOTE — Patient Instructions (Signed)
Diabetes Insipidus Diabetes insipidus (DI) is a rare condition that causes the body to produce more urine than normal. This leads to thirst and low fluid in the body (dehydration). In this condition, the urine is made mostly of water, or dilute urine. DI affects mostly adults, but it can happen at any age. There are four types of DI: Central DI. This is the most common type. Dipsogenic DI. Nephrogenic DI. Gestational DI. The most common forms of this condition are caused by a decrease in the production of the hormone that regulates urine output (antidiuretic hormone), or the body's resistance to this hormone. This condition is not related to type 1 or type 2 diabetes mellitus. What are the causes? Central DI is caused by damage to the pituitary gland or hypothalamus in the brain. Dipsogenic DI is caused by a defect in the thirst mechanism in the brain. This defect causes you to drink too much fluid. These may result from: Brain surgery. Infection. Inflammation. Brain tumor. Head injury. Nephrogenic DI is caused by the kidneys not responding to the antidiuretic hormone in the body. This may result from: Chronic kidney disease (CKD). Certain medicines, such as lithium. Low potassium levels. High calcium levels. Gestational DI is rare and is caused by the antidiuretic hormone that has stopped working properly. What are the signs or symptoms? Symptoms of this condition include: Excessive urination. This means urinating more than 10 cups (2.4 L) during a period of 24 hours. Excessive thirst. Too much nighttime urination (nocturia). Nausea. Diarrhea. How is this diagnosed? This condition may be diagnosed based on: Your medical history. A physical exam. Blood tests. Urine tests. A water deprivation test. During this test, you will stop drinking fluids for a period of time and your blood and urine will be checked regularly. An MRI. How is this treated? Once your specific type of  diabetes insipidus is diagnosed, treatment may include one or more of the following: Increasing or limiting your fluid intake. Taking medicines that contain artificial (synthetic) versions of the antidiuretic hormone. Stopping certain medicines that you take. Correcting the balance of minerals (electrolytes) in your body. Changing your diet. You may be put on a low-protein and low-sodium diet. You may need to visit your health care provider regularly to make sure your condition is being treated properly. You may also need to work with providers who specialize in: Kidney problems (nephrologist). Hormone disorders (endocrinologist). Follow these instructions at home: Eating and drinking Follow instructions from your health care provider about how much fluid and water to drink. You may be directed to drink more fluids and water, or to limit how much fluid and water you drink. Follow instructions from your health care provider about eating or drinking restrictions. General instructions Take over-the-counter and prescription medicines only as told by your health care provider. If directed, monitor your risk of dehydration in extreme heat. Carry a medical alert card or wear medical alert jewelry. Keep all follow-up visits as told by your health care provider. This is important. You may need to visit your health care provider regularly to make sure your condition is being treated properly. Contact a health care provider if: You continue to have symptoms after treatment. Get help right away if: You have extreme thirst. You have symptoms of severe dehydration, such as rapid heart rate, muscle cramps, or confusion. Summary Diabetes insipidus (DI) is a rare condition that causes the body to produce more urine than normal, which leads to thirst and dehydration. Follow instructions from  your health care provider about eating or drinking restrictions. ?Treatment may include increasing or limiting your  fluid intake and correcting the balance of minerals (electrolytes) in your body. ?Get help right away if you have symptoms of severe dehydration, such as rapid heart rate, muscle cramps, or confusion. ?This information is not intended to replace advice given to you by your health care provider. Make sure you discuss any questions you have with your health care provider. ?Document Revised: 03/31/2019 Document Reviewed: 03/31/2019 ?Elsevier Patient Education ? 2022 [employer] ? ?

## 2020-12-07 NOTE — Progress Notes (Signed)
New Patient Office Visit  Subjective:  Patient ID: Anthony Swanson, male    DOB: 1950-12-20  Age: 70 y.o. MRN: [id number]  CC:  Chief Complaint  Patient presents with   Medical Management of Chronic Issues   Diabetes   Hyperlipidemia   Hypertension    HPI Anthony Swanson presents for chronic follow up.  DM Patient denies foot ulcerations, hyperglycemia, increased appetite, paresthesia of the feet, polydipsia, visual disturbances, vomiting and weight loss.  He continues to have neuropathy at night. Gabapentin has helped some.    Current diabetic medications include: glipizide, jardiance Compliant with meds - Yes   Current monitoring regimen: home blood tests - not recently Any episodes of hypoglycemia? no   Eye exam current (within one year): yes went last month Is He on ACE inhibitor or angiotensin II receptor blocker? Yes Is He on statin? Yes   2. HTN Complaint with meds - Yes Current Medications - losartan, HCTZ  He walks daily for exercise. He tries to eat low salt.   Pertinent ROS: Headache - No Fatigue - No Visual Disturbances - No Chest pain - No Dyspnea - No Palpitations - No LE edema - No They report good compliance with medications and can restate their regimen by memory. No medication side effects.  BP Readings from Last 3 Encounters:  12/07/20 120/74  10/23/20 133/76  08/17/20 (!) 145/78    3. GERD Compliant with medications - Yes Current medications -omeprazole Cough - No Sore throat - No Voice change - No Hemoptysis - No Dysphagia or dyspepsia - No Water brash - No Red Flags (weight loss, hematochezia, melena, weight loss, early satiety, fevers, odynophagia, or persistent vomiting) - No    Past Medical History:  Diagnosis Date   Allergy    Arthritis    Asthma    Diabetes mellitus    Hyperlipidemia    Hypertension    Myocardial infarction [facility])     Past Surgical History:  Procedure Laterality Date   APPENDECTOMY  2020     Family History  Problem Relation Age of Onset   Heart attack Mother    Stroke Father    Cancer Brother    COPD Brother     Social History   Socioeconomic History   Marital status: Widowed    Spouse name: Not on file   Number of children: 3   Years of education: 8   Highest education level: 8th grade  Occupational History   Occupation: retired  Tobacco Use   Smoking status: Former    Packs/day: 1.00    Years: 6.00    Pack years: 6.00    Types: Cigarettes    Quit date: 1977    Years since quitting: 45.7   Smokeless tobacco: Current    Types: Nurse, children's Use: Never used  Substance and Sexual Activity   Alcohol use: No   Drug use: No   Sexual activity: Not Currently    Birth control/protection: None  Other Topics Concern   Not on file  Social History Narrative   ** Merged History Encounter **       Social Determinants of Health   Financial Resource Strain: Not on file  Food Insecurity: Not on file  Transportation Needs: Not on file  Physical Activity: Not on file  Stress: Not on file  Social Connections: Not on file  Intimate Partner Violence: Not on file    ROS As per HPI.  Objective:   Today's Vitals: BP 120/74   Pulse 87   Temp 97.9 F (36.6 C) (Temporal)   Ht 5' 6"  (1.676 m)   Wt 200 lb (90.7 kg)   BMI 32.28 kg/m   Physical Exam Vitals and nursing note reviewed.  Constitutional:      General: He is not in acute distress.    Appearance: He is not ill-appearing, toxic-appearing or diaphoretic.  Eyes:     Pupils: Pupils are equal, round, and reactive to light.  Cardiovascular:     Rate and Rhythm: Normal rate and regular rhythm.     Heart sounds: Normal heart sounds. No murmur heard. Pulmonary:     Effort: Pulmonary effort is normal. No respiratory distress.     Breath sounds: Normal breath sounds.  Abdominal:     General: Bowel sounds are normal. There is no distension.     Palpations: Abdomen is soft.      Tenderness: There is no abdominal tenderness. There is no guarding or rebound.  Musculoskeletal:     Right lower leg: No edema.     Left lower leg: No edema.  Skin:    General: Skin is warm and dry.  Neurological:     General: No focal deficit present.     Mental Status: He is alert and oriented to person, place, and time.  Psychiatric:        Mood and Affect: Mood normal.        Behavior: Behavior normal.    Assessment & Plan:   Dhaval was seen today for medical management of chronic issues, diabetes, hyperlipidemia and hypertension.  Diagnoses and all orders for this visit:  Type 2 diabetes mellitus with diabetic neuropathy, without long-term current use of insulin (HCC) A1c 8.3 not at goal today. Increase jardiance to 25 mg. Discussed diet and exercise.  -     Bayer DCA Hb A1c Waived -     Bayer DCA Hb A1c Waived -     empagliflozin (JARDIANCE) 25 MG TABS tablet; Take 1 tablet (25 mg total) by mouth daily before breakfast.        -     gabapentin (NEURONTIN) 600 MG tablet; Take 1 tablet (600 mg total) by mouth at bedtime.  Hyperlipidemia associated with type 2 diabetes mellitus ([place]) Labs pending. On statin. Discussed diet and exercise.  -     CMP14+EGFR -     Lipid panel  Hypertension associated with diabetes ([place]) Well controlled on current regimen. Labs pending. -     CBC with Differential/Platelet -     CMP14+EGFR  Gastroesophageal reflux disease without esophagitis Well controlled on current regimen.    Follow-up: Return in about 3 months (around 03/08/2021) for chronic follow up, please schedule with Monia Pouch since I will be out.  The patient indicates understanding of these issues and agrees with the plan.   Gwenlyn Perking, FNP

## 2020-12-08 LAB — CBC WITH DIFFERENTIAL/PLATELET
Basophils Absolute: 0.1 10*3/uL (ref 0.0–0.2)
Basos: 1 %
EOS (ABSOLUTE): 0.1 10*3/uL (ref 0.0–0.4)
Eos: 1 %
Hematocrit: 47.6 % (ref 37.5–51.0)
Hemoglobin: 16.3 g/dL (ref 13.0–17.7)
Immature Grans (Abs): 0.1 10*3/uL (ref 0.0–0.1)
Immature Granulocytes: 1 %
Lymphocytes Absolute: 1.6 10*3/uL (ref 0.7–3.1)
Lymphs: 15 %
MCH: 29.9 pg (ref 26.6–33.0)
MCHC: 34.2 g/dL (ref 31.5–35.7)
MCV: 87 fL (ref 79–97)
Monocytes Absolute: 0.9 10*3/uL (ref 0.1–0.9)
Monocytes: 8 %
Neutrophils Absolute: 7.9 10*3/uL — ABNORMAL HIGH (ref 1.4–7.0)
Neutrophils: 74 %
Platelets: 268 10*3/uL (ref 150–450)
RBC: 5.46 x10E6/uL (ref 4.14–5.80)
RDW: 12.5 % (ref 11.6–15.4)
WBC: 10.6 10*3/uL (ref 3.4–10.8)

## 2020-12-08 LAB — CMP14+EGFR
ALT: 29 IU/L (ref 0–44)
AST: 20 IU/L (ref 0–40)
Albumin/Globulin Ratio: 2.3 — ABNORMAL HIGH (ref 1.2–2.2)
Albumin: 4.9 g/dL — ABNORMAL HIGH (ref 3.7–4.7)
Alkaline Phosphatase: 63 IU/L (ref 44–121)
BUN/Creatinine Ratio: 19 (ref 10–24)
BUN: 21 mg/dL (ref 8–27)
Bilirubin Total: 0.4 mg/dL (ref 0.0–1.2)
CO2: 20 mmol/L (ref 20–29)
Calcium: 10.4 mg/dL — ABNORMAL HIGH (ref 8.6–10.2)
Chloride: 97 mmol/L (ref 96–106)
Creatinine, Ser: 1.12 mg/dL (ref 0.76–1.27)
Globulin, Total: 2.1 g/dL (ref 1.5–4.5)
Glucose: 134 mg/dL — ABNORMAL HIGH (ref 70–99)
Potassium: 4.2 mmol/L (ref 3.5–5.2)
Sodium: 138 mmol/L (ref 134–144)
Total Protein: 7 g/dL (ref 6.0–8.5)
eGFR: 70 mL/min/{1.73_m2} (ref >59)

## 2020-12-08 LAB — LIPID PANEL
Chol/HDL Ratio: 2.3 ratio (ref 0.0–5.0)
Cholesterol, Total: 150 mg/dL (ref 100–199)
HDL: 64 mg/dL (ref >39)
LDL Chol Calc (NIH): 71 mg/dL (ref 0–99)
Triglycerides: 79 mg/dL (ref 0–149)
VLDL Cholesterol Cal: 15 mg/dL (ref 5–40)

## 2020-12-19 DIAGNOSIS — J449 Chronic obstructive pulmonary disease, unspecified: Secondary | ICD-10-CM | POA: Diagnosis not present

## 2020-12-27 ENCOUNTER — Other Ambulatory Visit: Payer: Self-pay | Admitting: Family Medicine

## 2020-12-27 DIAGNOSIS — I152 Hypertension secondary to endocrine disorders: Secondary | ICD-10-CM

## 2020-12-27 DIAGNOSIS — K219 Gastro-esophageal reflux disease without esophagitis: Secondary | ICD-10-CM

## 2020-12-27 DIAGNOSIS — F5104 Psychophysiologic insomnia: Secondary | ICD-10-CM

## 2020-12-27 DIAGNOSIS — E1159 Type 2 diabetes mellitus with other circulatory complications: Secondary | ICD-10-CM

## 2020-12-27 DIAGNOSIS — E119 Type 2 diabetes mellitus without complications: Secondary | ICD-10-CM

## 2020-12-28 ENCOUNTER — Ambulatory Visit (INDEPENDENT_AMBULATORY_CARE_PROVIDER_SITE_OTHER): Payer: Medicare Other | Admitting: Family Medicine

## 2020-12-28 ENCOUNTER — Encounter: Payer: Self-pay | Admitting: Family Medicine

## 2020-12-28 ENCOUNTER — Other Ambulatory Visit: Payer: Self-pay | Admitting: Family Medicine

## 2020-12-28 DIAGNOSIS — K219 Gastro-esophageal reflux disease without esophagitis: Secondary | ICD-10-CM

## 2020-12-28 DIAGNOSIS — J0110 Acute frontal sinusitis, unspecified: Secondary | ICD-10-CM

## 2020-12-28 DIAGNOSIS — E1169 Type 2 diabetes mellitus with other specified complication: Secondary | ICD-10-CM

## 2020-12-28 DIAGNOSIS — E785 Hyperlipidemia, unspecified: Secondary | ICD-10-CM

## 2020-12-28 MED ORDER — DOXYCYCLINE HYCLATE 100 MG PO TABS
100.0000 mg | ORAL_TABLET | Freq: Two times a day (BID) | ORAL | 0 refills | Status: AC
Start: 2020-12-28 — End: 2021-01-04

## 2020-12-28 NOTE — Progress Notes (Signed)
   Virtual Visit  Note Due to COVID-19 pandemic this visit was conducted virtually. This visit type was conducted due to national recommendations for restrictions regarding the COVID-19 Pandemic (e.g. social distancing, sheltering in place) in an effort to limit this patient's exposure and mitigate transmission in our community. All issues noted in this document were discussed and addressed.  A physical exam was not performed with this format.  I connected with Anthony Swanson on 12/28/20 at 0813 by telephone and verified that I am speaking with the correct person using two identifiers. Anthony Swanson is currently located at home and no one is currently with him during the visit. The provider, Gabriel Earing, FNP is located in their office at time of visit.  I discussed the limitations, risks, security and privacy concerns of performing an evaluation and management service by telephone and the availability of in person appointments. I also discussed with the patient that there may be a patient responsible charge related to this service. The patient expressed understanding and agreed to proceed.  CC: sinusitis  History and Present Illness:  HPI Anthony Swanson reports a frontal headache x 1 week. He has been taking tylenol without improvement. It feels like a pressure around his eyes. He also reports head congestion and intermittent sore throat. He denies fever, chills, body aches, shortness of breath, chest pain, or cough. Denies dizziness, focal weakness, or changes in vision.     ROS As per HPI.   Observations/Objective: Alert and oriented x 3. Able to speak in full sentences without difficulty.   Assessment and Plan: Anthony Swanson was seen today for sinusitis.  Diagnoses and all orders for this visit:  Acute non-recurrent frontal sinusitis Doxycycline ordered as below. Discussed symptomatic care and return precautions.  -     doxycycline (VIBRA-TABS) 100 MG tablet; Take 1 tablet (100 mg total) by  mouth 2 (two) times daily for 7 days. 1 po bid   Follow Up Instructions: As needed.     I discussed the assessment and treatment plan with the patient. The patient was provided an opportunity to ask questions and all were answered. The patient agreed with the plan and demonstrated an understanding of the instructions.   The patient was advised to call back or seek an in-person evaluation if the symptoms worsen or if the condition fails to improve as anticipated.  The above assessment and management plan was discussed with the patient. The patient verbalized understanding of and has agreed to the management plan. Patient is aware to call the clinic if symptoms persist or worsen. Patient is aware when to return to the clinic for a follow-up visit. Patient educated on when it is appropriate to go to the emergency department.   Time call ended:  0825  I provided 12 minutes of  non face-to-face time during this encounter.    Gabriel Earing, FNP

## 2021-01-01 DIAGNOSIS — R059 Cough, unspecified: Secondary | ICD-10-CM | POA: Diagnosis not present

## 2021-01-01 DIAGNOSIS — R0789 Other chest pain: Secondary | ICD-10-CM | POA: Diagnosis not present

## 2021-01-01 DIAGNOSIS — J329 Chronic sinusitis, unspecified: Secondary | ICD-10-CM | POA: Diagnosis not present

## 2021-01-23 ENCOUNTER — Encounter: Payer: Self-pay | Admitting: Family Medicine

## 2021-01-23 ENCOUNTER — Ambulatory Visit (INDEPENDENT_AMBULATORY_CARE_PROVIDER_SITE_OTHER): Payer: Medicare Other | Admitting: Family Medicine

## 2021-01-23 DIAGNOSIS — J0100 Acute maxillary sinusitis, unspecified: Secondary | ICD-10-CM

## 2021-01-23 MED ORDER — LEVOFLOXACIN 500 MG PO TABS: 500.0000 mg | ORAL_TABLET | Freq: Every day | ORAL | 0 refills | Status: DC

## 2021-01-23 NOTE — Progress Notes (Signed)
6   Subjective:    Patient ID: Anthony Swanson, male    DOB: [birth year], 70 y.o.   MRN: [id number]   HPI: Anthony Swanson is a 70 y.o. male presenting for sinus infection. Eyes and face feel swollen. Symptoms include congestion, facial pain, nasal congestion, non productive cough, post nasal drip and sinus pressure. There is no fever, chills, or sweats. Onset of symptoms was 3 days ago, gradually worsening since that time. Denies dyspnea, using inhaler a little more than usual.     Depression screen [facility] 2/9 08/17/2020 07/12/2020 05/17/2020  Decreased Interest 0 0 0  Down, Depressed, Hopeless 0 0 1  PHQ - 2 Score 0 0 1     Relevant past medical, surgical, family and social history reviewed and updated as indicated.  Interim medical history since our last visit reviewed. Allergies and medications reviewed and updated.  ROS:  Review of Systems  Constitutional:  Negative for activity change, appetite change, chills and fever.  HENT:  Positive for congestion, postnasal drip, rhinorrhea and sinus pressure. Negative for ear discharge, ear pain, hearing loss, nosebleeds, sneezing and trouble swallowing.   Respiratory:  Negative for chest tightness and shortness of breath.   Cardiovascular:  Negative for chest pain and palpitations.  Skin:  Negative for rash.    Social History   Tobacco Use  Smoking Status Former   Packs/day: 1.00   Years: 6.00   Pack years: 6.00   Types: Cigarettes   Quit date: 1977   Years since quitting: 45.9  Smokeless Tobacco Current   Types: Chew       Objective:     Wt Readings from Last 3 Encounters:  12/07/20 200 lb (90.7 kg)  10/23/20 204 lb (92.5 kg)  08/17/20 196 lb 6 oz (89.1 kg)     Exam deferred. Pt. Harboring due to COVID 19. Phone visit performed.   Assessment & Plan:   1. Acute maxillary sinusitis, recurrence not specified     Meds ordered this encounter  Medications   levofloxacin (LEVAQUIN) 500 MG tablet    Sig: Take 1 tablet (500 mg  total) by mouth daily. For 10 days    Dispense:  10 tablet    Refill:  0    No orders of the defined types were placed in this encounter.     Diagnoses and all orders for this visit:  Acute maxillary sinusitis, recurrence not specified  Other orders -     levofloxacin (LEVAQUIN) 500 MG tablet; Take 1 tablet (500 mg total) by mouth daily. For 10 days   Virtual Visit via telephone Note  I discussed the limitations, risks, security and privacy concerns of performing an evaluation and management service by telephone and the availability of in person appointments. The patient was identified with two identifiers. Pt.expressed understanding and agreed to proceed. Pt. Is at home. Dr. Darlyn Read is in his office.  Follow Up Instructions:   I discussed the assessment and treatment plan with the patient. The patient was provided an opportunity to ask questions and all were answered. The patient agreed with the plan and demonstrated an understanding of the instructions.   The patient was advised to call back or seek an in-person evaluation if the symptoms worsen or if the condition fails to improve as anticipated.   Total minutes including chart review and phone contact time: 6   Follow up plan: No follow-ups on file.  Mechele Claude, MD [place]

## 2021-02-07 ENCOUNTER — Ambulatory Visit: Payer: Medicare Other | Admitting: Family Medicine

## 2021-02-08 ENCOUNTER — Ambulatory Visit (INDEPENDENT_AMBULATORY_CARE_PROVIDER_SITE_OTHER): Payer: Medicare Other | Admitting: Family Medicine

## 2021-02-08 ENCOUNTER — Encounter: Payer: Self-pay | Admitting: Family Medicine

## 2021-02-08 VITALS — BP 125/75 | HR 99 | Temp 98.0°F | Ht 66.0 in | Wt 197.5 lb

## 2021-02-08 DIAGNOSIS — E1140 Type 2 diabetes mellitus with diabetic neuropathy, unspecified: Secondary | ICD-10-CM | POA: Diagnosis not present

## 2021-02-08 DIAGNOSIS — L84 Corns and callosities: Secondary | ICD-10-CM | POA: Diagnosis not present

## 2021-02-08 DIAGNOSIS — F5104 Psychophysiologic insomnia: Secondary | ICD-10-CM

## 2021-02-08 MED ORDER — MIRTAZAPINE 7.5 MG PO TABS: 7.5000 mg | ORAL_TABLET | Freq: Every day | ORAL | 0 refills | Status: DC

## 2021-02-08 NOTE — Progress Notes (Signed)
Acute Office Visit  Subjective:    Patient ID: Anthony Swanson, male    DOB: Sep 08, 1950, 70 y.o.   MRN: [id number]  Chief Complaint  Patient presents with   Foot Pain    HPI Patient is in today for a referral to podiatry. He was seeing Dr. Irving Shows but reports that Dr. Irving Shows will not longer accept his insurance. He would like a referral to a new podiatrist. He has diabetic shoes and calluses on his feet. He felt like his color was off in his toes last night. He has a history of neuropathy but reports that this is stable for him.   He also reports insomnia for the last 2 years. He has trouble staying asleep. He wakes often throughout the night and then takes awhile for him to fall back to sleep. He has tried trazodone without improvement.   Depression screen [facility] 2/9 08/17/2020 07/12/2020 05/17/2020  Decreased Interest 0 0 0  Down, Depressed, Hopeless 0 0 1  PHQ - 2 Score 0 0 1    Past Medical History:  Diagnosis Date   Allergy    Arthritis    Asthma    Diabetes mellitus    Hyperlipidemia    Hypertension    Myocardial infarction [facility])     Past Surgical History:  Procedure Laterality Date   APPENDECTOMY  2020    Family History  Problem Relation Age of Onset   Heart attack Mother    Stroke Father    Cancer Brother    COPD Brother     Social History   Socioeconomic History   Marital status: Widowed    Spouse name: Not on file   Number of children: 3   Years of education: 8   Highest education level: 8th grade  Occupational History   Occupation: retired  Tobacco Use   Smoking status: Former    Packs/day: 1.00    Years: 6.00    Pack years: 6.00    Types: Cigarettes    Quit date: 1977    Years since quitting: 45.9   Smokeless tobacco: Current    Types: Nurse, children's Use: Never used  Substance and Sexual Activity   Alcohol use: No   Drug use: No   Sexual activity: Not Currently    Birth control/protection: None  Other Topics Concern   Not on file   Social History Narrative   ** Merged History Encounter **       Social Determinants of Health   Financial Resource Strain: Not on file  Food Insecurity: Not on file  Transportation Needs: Not on file  Physical Activity: Not on file  Stress: Not on file  Social Connections: Not on file  Intimate Partner Violence: Not on file    Outpatient Medications Prior to Visit  Medication Sig Dispense Refill   ACCU-CHEK GUIDE test strip Test BS daily Dx E11.9 100 each 3   Accu-Chek Softclix Lancets lancets Test BS daily Dx E11.9 100 each 3   acetaminophen (TYLENOL) 500 MG tablet Take 500 mg by mouth every 6 (six) hours as needed. For pain     albuterol (PROVENTIL) (2.5 MG/3ML) 0.083% nebulizer solution SMARTSIG:1 Vial(s) Via Nebulizer 4 Times Daily PRN     albuterol (VENTOLIN HFA) 108 (90 Base) MCG/ACT inhaler Inhale 2 puffs into the lungs every 6 (six) hours as needed. For shortness of breath 18 g 5   Blood Glucose Monitoring Suppl (ACCU-CHEK GUIDE) w/Device KIT Test BS  daily Dx E11.9 1 kit 0   empagliflozin (JARDIANCE) 25 MG TABS tablet Take 1 tablet (25 mg total) by mouth daily before breakfast. 90 tablet 1   famotidine (PEPCID) 20 MG tablet TAKE 1 TABLET BY MOUTH EVERY DAY AFTER dinner 90 tablet 0   gabapentin (NEURONTIN) 600 MG tablet Take 1 tablet (600 mg total) by mouth at bedtime. 90 tablet 0   glipiZIDE (GLUCOTROL) 10 MG tablet TAKE 1 TABLET BY MOUTH TWICE DAILY 180 tablet 0   hydrochlorothiazide (HYDRODIURIL) 25 MG tablet Take 1 tablet (25 mg total) by mouth daily. 90 tablet 3   lidocaine (XYLOCAINE) 5 % ointment Apply 1 application topically as needed. Use for back pain     losartan (COZAAR) 50 MG tablet TAKE 1 TABLET BY MOUTH EVERY DAY 90 tablet 0   omeprazole (PRILOSEC) 40 MG capsule TAKE ONE CAPSULE BY MOUTH EVERY MORNING 90 capsule 0   simvastatin (ZOCOR) 20 MG tablet TAKE 1 TABLET BY MOUTH EVERY MORNING 90 tablet 0   levofloxacin (LEVAQUIN) 500 MG tablet Take 1 tablet (500 mg  total) by mouth daily. For 10 days 10 tablet 0   No facility-administered medications prior to visit.    Allergies  Allergen Reactions   Codeine    Penicillins    Percocet [Oxycodone-Acetaminophen]    Sulfa Antibiotics     Review of Systems As per HPI.     Objective:    Physical Exam Vitals and nursing note reviewed.  Constitutional:      General: He is not in acute distress.    Appearance: He is not ill-appearing, toxic-appearing or diaphoretic.  Cardiovascular:     Rate and Rhythm: Normal rate and regular rhythm.     Heart sounds: Normal heart sounds. No murmur heard. Pulmonary:     Effort: Pulmonary effort is normal. No respiratory distress.     Breath sounds: Normal breath sounds.  Feet:     Right foot:     Skin integrity: Callus and dry skin present. No ulcer, blister, skin breakdown, erythema, warmth or fissure.     Toenail Condition: Right toenails are abnormally thick.     Left foot:     Skin integrity: Callus and dry skin present. No ulcer, blister, skin breakdown, erythema, warmth or fissure.     Toenail Condition: Left toenails are abnormally thick.  Skin:    General: Skin is warm and dry.     Capillary Refill: Capillary refill takes 2 to 3 seconds.  Neurological:     Mental Status: He is alert and oriented to person, place, and time. Mental status is at baseline.    BP 125/75   Pulse 99   Temp 98 F (36.7 C) (Temporal)   Ht 5' 6"  (1.676 m)   Wt 197 lb 8 oz (89.6 kg)   BMI 31.88 kg/m  Wt Readings from Last 3 Encounters:  02/08/21 197 lb 8 oz (89.6 kg)  12/07/20 200 lb (90.7 kg)  10/23/20 204 lb (92.5 kg)    Health Maintenance Due  Topic Date Due   Pneumonia Vaccine 63+ Years old (2 - PPSV23 if available, else PCV20) 06/05/2017    There are no preventive care reminders to display for this patient.   No results found for: TSH Lab Results  Component Value Date   WBC 10.6 12/07/2020   HGB 16.3 12/07/2020   HCT 47.6 12/07/2020   MCV 87  12/07/2020   PLT 268 12/07/2020   Lab Results  Component Value Date  NA 138 12/07/2020   K 4.2 12/07/2020   CO2 20 12/07/2020   GLUCOSE 134 (H) 12/07/2020   BUN 21 12/07/2020   CREATININE 1.12 12/07/2020   BILITOT 0.4 12/07/2020   ALKPHOS 63 12/07/2020   AST 20 12/07/2020   ALT 29 12/07/2020   PROT 7.0 12/07/2020   ALBUMIN 4.9 (H) 12/07/2020   CALCIUM 10.4 (H) 12/07/2020   EGFR 70 12/07/2020   Lab Results  Component Value Date   CHOL 150 12/07/2020   Lab Results  Component Value Date   HDL 64 12/07/2020   Lab Results  Component Value Date   LDLCALC 71 12/07/2020   Lab Results  Component Value Date   TRIG 79 12/07/2020   Lab Results  Component Value Date   CHOLHDL 2.3 12/07/2020   Lab Results  Component Value Date   HGBA1C 8.2 (H) 12/07/2020       Assessment & Plan:   Anthony Swanson was seen today for foot pain.  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 No skin breakdown or ulcers today.  -     Ambulatory referral to Podiatry  Chronic insomnia Failed trazodone. Will try remeron.  -     mirtazapine (REMERON) 7.5 MG tablet; Take 1 tablet (7.5 mg total) by mouth at bedtime.  Return to office for new or worsening symptoms, or if symptoms persist. Keep chronic follow up appointment.   The patient indicates understanding of these issues and agrees with the plan.  Gwenlyn Perking, FNP

## 2021-02-08 NOTE — Patient Instructions (Signed)

## 2021-02-25 ENCOUNTER — Other Ambulatory Visit: Payer: Self-pay | Admitting: Family Medicine

## 2021-02-25 DIAGNOSIS — E1140 Type 2 diabetes mellitus with diabetic neuropathy, unspecified: Secondary | ICD-10-CM

## 2021-03-08 ENCOUNTER — Other Ambulatory Visit: Payer: Self-pay

## 2021-03-08 ENCOUNTER — Ambulatory Visit (INDEPENDENT_AMBULATORY_CARE_PROVIDER_SITE_OTHER): Payer: Medicare Other | Admitting: Family Medicine

## 2021-03-08 ENCOUNTER — Encounter: Payer: Self-pay | Admitting: Family Medicine

## 2021-03-08 VITALS — BP 137/78 | HR 91 | Temp 98.2°F | Ht 66.0 in | Wt 199.0 lb

## 2021-03-08 DIAGNOSIS — F5101 Primary insomnia: Secondary | ICD-10-CM

## 2021-03-08 DIAGNOSIS — E785 Hyperlipidemia, unspecified: Secondary | ICD-10-CM

## 2021-03-08 DIAGNOSIS — E1169 Type 2 diabetes mellitus with other specified complication: Secondary | ICD-10-CM | POA: Diagnosis not present

## 2021-03-08 DIAGNOSIS — E1159 Type 2 diabetes mellitus with other circulatory complications: Secondary | ICD-10-CM | POA: Diagnosis not present

## 2021-03-08 DIAGNOSIS — E1140 Type 2 diabetes mellitus with diabetic neuropathy, unspecified: Secondary | ICD-10-CM

## 2021-03-08 DIAGNOSIS — I152 Hypertension secondary to endocrine disorders: Secondary | ICD-10-CM

## 2021-03-08 LAB — BAYER DCA HB A1C WAIVED: HB A1C (BAYER DCA - WAIVED): 8.6 % — ABNORMAL HIGH (ref 4.8–5.6)

## 2021-03-08 MED ORDER — RYBELSUS 3 MG PO TABS: 3.0000 mg | ORAL_TABLET | Freq: Every day | ORAL | 0 refills | Status: DC

## 2021-03-08 NOTE — Progress Notes (Signed)
Subjective:  Patient ID: Anthony Swanson, male    DOB: Jun 10, 1950, 70 y.o.   MRN: [id number]  Patient Care Team: Gwenlyn Perking, FNP as PCP - General (Family Medicine) Celestia Khat, [place] (Optometry)   Chief Complaint:  Diabetes (3 month follow up)   HPI: IHAN PAT is a 70 y.o. male presenting on 03/08/2021 for Diabetes (3 month follow up)   Patient presents today for management of chronic medical conditions.  He has been taking his medications as prescribed without associated side effects.  States the medications he was prescribed for insomnia are not beneficial.  He has not tried melatonin with the Remeron, and has not increased the Remeron dose.  Diabetes He presents for his follow-up diabetic visit. He has type 2 diabetes mellitus. Pertinent negatives for hypoglycemia include no confusion, dizziness, headaches, seizures, speech difficulty, sweats or tremors. Pertinent negatives for diabetes include no blurred vision, no chest pain, no fatigue, no foot paresthesias, no foot ulcerations, no polydipsia, no polyphagia, no polyuria, no visual change, no weakness and no weight loss. Diabetic complications include heart disease. Risk factors for coronary artery disease include diabetes mellitus, dyslipidemia, male sex, hypertension, obesity, sedentary lifestyle and stress. An ACE inhibitor/angiotensin II receptor blocker is being taken. He sees a podiatrist.Eye exam is current.  Hyperlipidemia This is a chronic problem. The current episode started more than 1 year ago. Exacerbating diseases include diabetes and obesity. Pertinent negatives include no chest pain, focal sensory loss, leg pain or shortness of breath. Current antihyperlipidemic treatment includes statins. The current treatment provides moderate improvement of lipids. Compliance problems include adherence to diet and adherence to exercise.  Risk factors for coronary artery disease include dyslipidemia, diabetes mellitus,  hypertension, male sex, obesity and a sedentary lifestyle.  Hypertension This is a chronic problem. The current episode started more than 1 year ago. Associated symptoms include malaise/fatigue. Pertinent negatives include no anxiety, blurred vision, chest pain, headaches, neck pain, orthopnea, palpitations, peripheral edema, PND, shortness of breath or sweats. Risk factors for coronary artery disease include dyslipidemia, diabetes mellitus, male gender, obesity and sedentary lifestyle. Past treatments include angiotensin blockers.  Insomnia Primary symptoms: sleep disturbance, difficulty falling asleep, premature morning awakening, malaise/fatigue.   The current episode started more than one year. The problem occurs nightly. The problem is unchanged. The symptoms are aggravated by emotional upset. How many beverages per day that contain caffeine: 4-5.  Nothing relieves the symptoms. Past treatments include medication. The treatment provided mild relief. Typical bedtime:  8-10 P.M..  How long after going to bed to you fall asleep: over an hour.      Relevant past medical, surgical, family, and social history reviewed and updated as indicated.  Allergies and medications reviewed and updated. Data reviewed: Chart in Epic.   Past Medical History:  Diagnosis Date   Allergy    Arthritis    Asthma    Diabetes mellitus    Hyperlipidemia    Hypertension    Myocardial infarction [facility])     Past Surgical History:  Procedure Laterality Date   APPENDECTOMY  2020    Social History   Socioeconomic History   Marital status: Widowed    Spouse name: Not on file   Number of children: 3   Years of education: 8   Highest education level: 8th grade  Occupational History   Occupation: retired  Tobacco Use   Smoking status: Former    Packs/day: 1.00    Years: 6.00  Pack years: 6.00    Types: Cigarettes    Quit date: 72    Years since quitting: 46.0   Smokeless tobacco: Current    Types:  Chew  Vaping Use   Vaping Use: Never used  Substance and Sexual Activity   Alcohol use: No   Drug use: No   Sexual activity: Not Currently    Birth control/protection: None  Other Topics Concern   Not on file  Social History Narrative   ** Merged History Encounter **       Social Determinants of Health   Financial Resource Strain: Not on file  Food Insecurity: Not on file  Transportation Needs: Not on file  Physical Activity: Not on file  Stress: Not on file  Social Connections: Not on file  Intimate Partner Violence: Not on file    Outpatient Encounter Medications as of 03/08/2021  Medication Sig   ACCU-CHEK GUIDE test strip Test BS daily Dx E11.9   Accu-Chek Softclix Lancets lancets Test BS daily Dx E11.9   acetaminophen (TYLENOL) 500 MG tablet Take 500 mg by mouth every 6 (six) hours as needed. For pain   albuterol (PROVENTIL) (2.5 MG/3ML) 0.083% nebulizer solution SMARTSIG:1 Vial(s) Via Nebulizer 4 Times Daily PRN   albuterol (VENTOLIN HFA) 108 (90 Base) MCG/ACT inhaler Inhale 2 puffs into the lungs every 6 (six) hours as needed. For shortness of breath   Blood Glucose Monitoring Suppl (ACCU-CHEK GUIDE) w/Device KIT Test BS daily Dx E11.9   empagliflozin (JARDIANCE) 25 MG TABS tablet Take 1 tablet (25 mg total) by mouth daily before breakfast.   famotidine (PEPCID) 20 MG tablet TAKE 1 TABLET BY MOUTH EVERY DAY AFTER dinner   gabapentin (NEURONTIN) 600 MG tablet TAKE 1 TABLET BY MOUTH AT BEDTIME   glipiZIDE (GLUCOTROL) 10 MG tablet TAKE 1 TABLET BY MOUTH TWICE DAILY   hydrochlorothiazide (HYDRODIURIL) 25 MG tablet Take 1 tablet (25 mg total) by mouth daily.   lidocaine (XYLOCAINE) 5 % ointment Apply 1 application topically as needed. Use for back pain   losartan (COZAAR) 50 MG tablet TAKE 1 TABLET BY MOUTH EVERY DAY   mirtazapine (REMERON) 7.5 MG tablet Take 1 tablet (7.5 mg total) by mouth at bedtime.   omeprazole (PRILOSEC) 40 MG capsule TAKE ONE CAPSULE BY MOUTH EVERY  MORNING   Semaglutide (RYBELSUS) 3 MG TABS Take 3 mg by mouth daily.   simvastatin (ZOCOR) 20 MG tablet TAKE 1 TABLET BY MOUTH EVERY MORNING   No facility-administered encounter medications on file as of 03/08/2021.    Allergies  Allergen Reactions   Codeine    Penicillins    Percocet [Oxycodone-Acetaminophen]    Sulfa Antibiotics     Review of Systems  Constitutional:  Positive for malaise/fatigue. Negative for activity change, chills, diaphoresis, fatigue, fever, unexpected weight change and weight loss.  Eyes:  Negative for blurred vision.  Respiratory:  Negative for cough and shortness of breath.   Cardiovascular:  Negative for chest pain, palpitations, orthopnea, leg swelling and PND.  Gastrointestinal:  Negative for abdominal pain.  Endocrine: Negative for polydipsia, polyphagia and polyuria.  Genitourinary:  Negative for decreased urine volume.  Musculoskeletal:  Positive for arthralgias. Negative for neck pain.  Neurological:  Negative for dizziness, tremors, seizures, syncope, facial asymmetry, speech difficulty, weakness, light-headedness, numbness and headaches.  Psychiatric/Behavioral:  Positive for sleep disturbance. Negative for confusion. The patient has insomnia.   All other systems reviewed and are negative.      Objective:  BP 137/78  Pulse 91    Temp 98.2 F (36.8 C)    Ht 5' 6"  (1.676 m)    Wt 199 lb (90.3 kg)    SpO2 96%    BMI 32.12 kg/m    Wt Readings from Last 3 Encounters:  03/08/21 199 lb (90.3 kg)  02/08/21 197 lb 8 oz (89.6 kg)  12/07/20 200 lb (90.7 kg)    Physical Exam Vitals and nursing note reviewed.  Constitutional:      General: He is not in acute distress.    Appearance: Normal appearance. He is obese. He is not ill-appearing, toxic-appearing or diaphoretic.  HENT:     Head: Normocephalic and atraumatic.     Mouth/Throat:     Mouth: Mucous membranes are moist.  Eyes:     Pupils: Pupils are equal, round, and reactive to light.   Cardiovascular:     Rate and Rhythm: Normal rate and regular rhythm.     Heart sounds: Normal heart sounds.  Pulmonary:     Effort: Pulmonary effort is normal.     Breath sounds: Normal breath sounds.  Abdominal:     General: Bowel sounds are normal.     Palpations: Abdomen is soft.  Musculoskeletal:     Right lower leg: No edema.     Left lower leg: No edema.  Skin:    General: Skin is warm and dry.     Capillary Refill: Capillary refill takes less than 2 seconds.  Neurological:     General: No focal deficit present.     Mental Status: He is alert and oriented to person, place, and time.  Psychiatric:        Mood and Affect: Mood normal.        Behavior: Behavior normal.        Thought Content: Thought content normal.        Judgment: Judgment normal.    Results for orders placed or performed in visit on 03/08/21  Bayer DCA Hb A1c Waived  Result Value Ref Range   HB A1C (BAYER DCA - WAIVED) 8.6 (H) 4.8 - 5.6 %       Pertinent labs & imaging results that were available during my care of the patient were reviewed by me and considered in my medical decision making.  Assessment & Plan:  Doc was seen today for diabetes.  Diagnoses and all orders for this visit:  Type 2 diabetes mellitus with diabetic neuropathy, without long-term current use of insulin (HCC) A1C 8.6 controlled. Will add Rybelsus to regimen. Monitor BS and report any persistent high or low readings. Diet and exercise encouraged. Follow up in 3 months.  -     Bayer DCA Hb A1c Waived -     Basic Metabolic Panel -     Semaglutide (RYBELSUS) 3 MG TABS; Take 3 mg by mouth daily.  Hypertension associated with diabetes ([place]) BP well controlled. Changes were not made in regimen tody. Goal BP is 130/80. Pt aware to report any persistent high or low readings. DASH diet and exercise encouraged. Exercise at least 150 minutes per week and increase as tolerated. Goal BMI > 25. Stress management encouraged. Avoid  nicotine and tobacco product use. Avoid excessive alcohol and NSAID's. Avoid more than 2000 mg of sodium daily. Medications as prescribed. Follow up as scheduled.   Hyperlipidemia associated with type 2 diabetes mellitus ([place]) Diet encouraged - increase intake of fresh fruits and vegetables, increase intake of lean proteins. Bake, broil, or grill foods.  Avoid fried, greasy, and fatty foods. Avoid fast foods. Increase intake of fiber-rich whole grains. Exercise encouraged - at least 150 minutes per week and advance as tolerated.  Goal BMI < 25. Continue medications as prescribed. Follow up in 3 months as discussed.   Primary insomnia Can add melatonin to current medication or try increasing Remeron dosing.  Sleep hygiene discussed in detail. Report any new or worsening symptoms.     Continue all other maintenance medications.  Follow up plan: Return in about 3 months (around 06/06/2021), or if symptoms worsen or fail to improve, for PCP for DM.   Continue healthy lifestyle choices, including diet (rich in fruits, vegetables, and lean proteins, and low in salt and simple carbohydrates) and exercise (at least 30 minutes of moderate physical activity daily).  Educational handout given for DM  The above assessment and management plan was discussed with the patient. The patient verbalized understanding of and has agreed to the management plan. Patient is aware to call the clinic if they develop any new symptoms or if symptoms persist or worsen. Patient is aware when to return to the clinic for a follow-up visit. Patient educated on when it is appropriate to go to the emergency department.   Monia Pouch, FNP-C [facility] [phone]

## 2021-03-08 NOTE — Patient Instructions (Signed)

## 2021-03-09 LAB — BASIC METABOLIC PANEL
BUN/Creatinine Ratio: 15 (ref 10–24)
BUN: 19 mg/dL (ref 8–27)
CO2: 22 mmol/L (ref 20–29)
Calcium: 9.7 mg/dL (ref 8.6–10.2)
Chloride: 98 mmol/L (ref 96–106)
Creatinine, Ser: 1.28 mg/dL — ABNORMAL HIGH (ref 0.76–1.27)
Glucose: 312 mg/dL — ABNORMAL HIGH (ref 70–99)
Potassium: 4.3 mmol/L (ref 3.5–5.2)
Sodium: 136 mmol/L (ref 134–144)
eGFR: 60 mL/min/{1.73_m2} (ref >59)

## 2021-03-13 ENCOUNTER — Ambulatory Visit (INDEPENDENT_AMBULATORY_CARE_PROVIDER_SITE_OTHER): Payer: Commercial Managed Care - HMO | Admitting: Family Medicine

## 2021-03-13 ENCOUNTER — Encounter: Payer: Self-pay | Admitting: Family Medicine

## 2021-03-13 VITALS — BP 133/84 | HR 78 | Temp 97.4°F | Ht 66.0 in | Wt 196.4 lb

## 2021-03-13 DIAGNOSIS — N289 Disorder of kidney and ureter, unspecified: Secondary | ICD-10-CM

## 2021-03-13 DIAGNOSIS — S0231XA Fracture of orbital floor, right side, initial encounter for closed fracture: Secondary | ICD-10-CM | POA: Diagnosis not present

## 2021-03-13 DIAGNOSIS — T148XXA Other injury of unspecified body region, initial encounter: Secondary | ICD-10-CM

## 2021-03-13 DIAGNOSIS — S0181XA Laceration without foreign body of other part of head, initial encounter: Secondary | ICD-10-CM

## 2021-03-13 NOTE — Progress Notes (Signed)
Assessment & Plan:  1. Closed fracture of right orbital floor, initial encounter [facility]) Patient to keep appointment tomorrow with [facility]. Referral placed to ensure follow-up with plastic surgery. - Ambulatory referral to Plastic Surgery  2. Laceration of multiple sites of face Healing well.  3. Bruising Arnicare gel.  4. Function kidney decreased - BMP8+EGFR   Return if symptoms worsen or fail to improve.  Hendricks Limes, MSN, APRN, FNP-C [facility]  Subjective:    Patient ID: Anthony Swanson, male    DOB: [birth year], 71 y.o.   MRN: [id number]  Patient Care Team: Gwenlyn Perking, FNP as PCP - General (Family Medicine) Celestia Khat, [place] (Optometry)   Chief Complaint:  Chief Complaint  Patient presents with   ER follow up     12/31- [facility] - Fall     HPI: Anthony Swanson is a 71 y.o. male presenting on 03/13/2021 for ER follow up  (12/31- [facility] - Fall )  Patient is here to follow-up from his ER visit to [facility]. Patient sustained right orbital floor fracture after a fall at home. It was recommended he follow-up with his local eye doctor and return to the plastic surgeon in 1-2 weeks. Patient is scheduled to see Dr. Wynetta Emery at [facility] tomorrow. He does not yet have a follow-up scheduled with the plastic surgeon.   No falls since this event and none before. He is not sure what caused him to fall.  New complaints: Patient is requesting repeat lab work today due to a recent decrease in kidney function.   Social history:  Relevant past medical, surgical, family and social history reviewed and updated as indicated. Interim medical history since our last visit reviewed.  Allergies and medications reviewed and updated.  DATA REVIEWED: CHART IN EPIC  ROS: Negative unless specifically indicated above in HPI.    Current Outpatient Medications:    ACCU-CHEK GUIDE test strip, Test BS daily Dx E11.9, Disp: 100 each, Rfl: 3    Accu-Chek Softclix Lancets lancets, Test BS daily Dx E11.9, Disp: 100 each, Rfl: 3   acetaminophen (TYLENOL) 500 MG tablet, Take 500 mg by mouth every 6 (six) hours as needed. For pain, Disp: , Rfl:    albuterol (PROVENTIL) (2.5 MG/3ML) 0.083% nebulizer solution, SMARTSIG:1 Vial(s) Via Nebulizer 4 Times Daily PRN, Disp: , Rfl:    albuterol (VENTOLIN HFA) 108 (90 Base) MCG/ACT inhaler, Inhale 2 puffs into the lungs every 6 (six) hours as needed. For shortness of breath, Disp: 18 g, Rfl: 5   Blood Glucose Monitoring Suppl (ACCU-CHEK GUIDE) w/Device KIT, Test BS daily Dx E11.9, Disp: 1 kit, Rfl: 0   empagliflozin (JARDIANCE) 25 MG TABS tablet, Take 1 tablet (25 mg total) by mouth daily before breakfast., Disp: 90 tablet, Rfl: 1   famotidine (PEPCID) 20 MG tablet, TAKE 1 TABLET BY MOUTH EVERY DAY AFTER dinner, Disp: 90 tablet, Rfl: 0   gabapentin (NEURONTIN) 600 MG tablet, TAKE 1 TABLET BY MOUTH AT BEDTIME, Disp: 90 tablet, Rfl: 0   glipiZIDE (GLUCOTROL) 10 MG tablet, TAKE 1 TABLET BY MOUTH TWICE DAILY, Disp: 180 tablet, Rfl: 0   hydrochlorothiazide (HYDRODIURIL) 25 MG tablet, Take 1 tablet (25 mg total) by mouth daily., Disp: 90 tablet, Rfl: 3   lidocaine (XYLOCAINE) 5 % ointment, Apply 1 application topically as needed. Use for back pain, Disp: , Rfl:    losartan (COZAAR) 50 MG tablet, TAKE 1 TABLET BY MOUTH EVERY DAY, Disp: 90 tablet, Rfl:  0   omeprazole (PRILOSEC) 40 MG capsule, TAKE ONE CAPSULE BY MOUTH EVERY MORNING, Disp: 90 capsule, Rfl: 0   Semaglutide (RYBELSUS) 3 MG TABS, Take 3 mg by mouth daily., Disp: 30 tablet, Rfl: 0   simvastatin (ZOCOR) 20 MG tablet, TAKE 1 TABLET BY MOUTH EVERY MORNING, Disp: 90 tablet, Rfl: 0   mirtazapine (REMERON) 7.5 MG tablet, Take 1 tablet (7.5 mg total) by mouth at bedtime. (Patient not taking: Reported on 03/13/2021), Disp: 90 tablet, Rfl: 0   Allergies  Allergen Reactions   Codeine    Penicillins    Percocet [Oxycodone-Acetaminophen]    Sulfa Antibiotics     Past Medical History:  Diagnosis Date   Allergy    Arthritis    Asthma    Diabetes mellitus    Hyperlipidemia    Hypertension    Myocardial infarction [facility])     Past Surgical History:  Procedure Laterality Date   APPENDECTOMY  2020    Social History   Socioeconomic History   Marital status: Widowed    Spouse name: Not on file   Number of children: 3   Years of education: 8   Highest education level: 8th grade  Occupational History   Occupation: retired  Tobacco Use   Smoking status: Former    Packs/day: 1.00    Years: 6.00    Pack years: 6.00    Types: Cigarettes    Quit date: 1977    Years since quitting: 46.0   Smokeless tobacco: Current    Types: Nurse, children's Use: Never used  Substance and Sexual Activity   Alcohol use: No   Drug use: No   Sexual activity: Not Currently    Birth control/protection: None  Other Topics Concern   Not on file  Social History Narrative   ** Merged History Encounter **       Social Determinants of Health   Financial Resource Strain: Not on file  Food Insecurity: Not on file  Transportation Needs: Not on file  Physical Activity: Not on file  Stress: Not on file  Social Connections: Not on file  Intimate Partner Violence: Not on file        Objective:    BP 133/84    Pulse 78    Temp (!) 97.4 F (36.3 C) (Temporal)    Ht 5' 6"  (1.676 m)    Wt 196 lb 6.4 oz (89.1 kg)    SpO2 96%    BMI 31.70 kg/m   Wt Readings from Last 3 Encounters:  03/13/21 196 lb 6.4 oz (89.1 kg)  03/08/21 199 lb (90.3 kg)  02/08/21 197 lb 8 oz (89.6 kg)    Physical Exam Vitals reviewed.  Constitutional:      General: He is not in acute distress.    Appearance: Normal appearance. He is not ill-appearing, toxic-appearing or diaphoretic.  HENT:     Head: Normocephalic and atraumatic.  Eyes:     General: No scleral icterus.       Right eye: No discharge.        Left eye: No discharge.     Conjunctiva/sclera: Conjunctivae  normal.  Cardiovascular:     Rate and Rhythm: Normal rate.  Pulmonary:     Effort: Pulmonary effort is normal. No respiratory distress.  Musculoskeletal:        General: Normal range of motion.     Cervical back: Normal range of motion.  Skin:    General: Skin  is warm and dry.     Findings: Laceration (above and below right eye are healing well) present.  Neurological:     Mental Status: He is alert and oriented to person, place, and time. Mental status is at baseline.  Psychiatric:        Mood and Affect: Mood normal.        Behavior: Behavior normal.        Thought Content: Thought content normal.        Judgment: Judgment normal.    No results found for: TSH Lab Results  Component Value Date   WBC 10.6 12/07/2020   HGB 16.3 12/07/2020   HCT 47.6 12/07/2020   MCV 87 12/07/2020   PLT 268 12/07/2020   Lab Results  Component Value Date   NA 136 03/08/2021   K 4.3 03/08/2021   CO2 22 03/08/2021   GLUCOSE 312 (H) 03/08/2021   BUN 19 03/08/2021   CREATININE 1.28 (H) 03/08/2021   BILITOT 0.4 12/07/2020   ALKPHOS 63 12/07/2020   AST 20 12/07/2020   ALT 29 12/07/2020   PROT 7.0 12/07/2020   ALBUMIN 4.9 (H) 12/07/2020   CALCIUM 9.7 03/08/2021   EGFR 60 03/08/2021   Lab Results  Component Value Date   CHOL 150 12/07/2020   Lab Results  Component Value Date   HDL 64 12/07/2020   Lab Results  Component Value Date   LDLCALC 71 12/07/2020   Lab Results  Component Value Date   TRIG 79 12/07/2020   Lab Results  Component Value Date   CHOLHDL 2.3 12/07/2020   Lab Results  Component Value Date   HGBA1C 8.6 (H) 03/08/2021

## 2021-03-13 NOTE — Patient Instructions (Signed)
Arnicare Gel for bruising.  

## 2021-03-14 ENCOUNTER — Telehealth: Payer: Self-pay | Admitting: Family Medicine

## 2021-03-14 LAB — BMP8+EGFR
BUN/Creatinine Ratio: 13 (ref 10–24)
BUN: 15 mg/dL (ref 8–27)
CO2: 23 mmol/L (ref 20–29)
Calcium: 9.8 mg/dL (ref 8.6–10.2)
Chloride: 99 mmol/L (ref 96–106)
Creatinine, Ser: 1.12 mg/dL (ref 0.76–1.27)
Glucose: 126 mg/dL — ABNORMAL HIGH (ref 70–99)
Potassium: 4.6 mmol/L (ref 3.5–5.2)
Sodium: 136 mmol/L (ref 134–144)
eGFR: 70 mL/min/{1.73_m2} (ref >59)

## 2021-03-14 LAB — HM DIABETES EYE EXAM

## 2021-03-14 MED ORDER — BLOOD GLUCOSE METER KIT: PACK | 0 refills | Status: DC

## 2021-03-14 NOTE — Telephone Encounter (Signed)
Can we please try reaching out to patient to make him aware of this appointment?

## 2021-03-14 NOTE — Telephone Encounter (Signed)
Pt called requesting that PCP send in Rx for him to get a sugar meter. Wants it sent to [facility].  Explained to pt that his PCP is out on maternity leave so it will be provider who is covering for Tiffany, that will advise on this request.  Pt voiced understanding.

## 2021-03-14 NOTE — Telephone Encounter (Signed)
Patient aware.

## 2021-03-14 NOTE — Telephone Encounter (Signed)
Marcelino Duster called from [facility] stating that they received referral for pt but says pt already has an appt to see Dr Hardie Pulley in the plastic surgery dept at the main campus in [place] on 03/22/21 at 11:00 AM.  Says she has tried reaching out to pt about this but pt did not answer phone.

## 2021-03-19 ENCOUNTER — Other Ambulatory Visit: Payer: Self-pay | Admitting: Family Medicine

## 2021-03-19 DIAGNOSIS — E1169 Type 2 diabetes mellitus with other specified complication: Secondary | ICD-10-CM

## 2021-03-19 DIAGNOSIS — E785 Hyperlipidemia, unspecified: Secondary | ICD-10-CM

## 2021-03-19 DIAGNOSIS — I152 Hypertension secondary to endocrine disorders: Secondary | ICD-10-CM

## 2021-03-19 DIAGNOSIS — E1140 Type 2 diabetes mellitus with diabetic neuropathy, unspecified: Secondary | ICD-10-CM

## 2021-03-19 DIAGNOSIS — K219 Gastro-esophageal reflux disease without esophagitis: Secondary | ICD-10-CM

## 2021-03-19 DIAGNOSIS — E119 Type 2 diabetes mellitus without complications: Secondary | ICD-10-CM

## 2021-04-02 ENCOUNTER — Other Ambulatory Visit: Payer: Self-pay | Admitting: Nurse Practitioner

## 2021-04-02 DIAGNOSIS — F5104 Psychophysiologic insomnia: Secondary | ICD-10-CM

## 2021-04-03 ENCOUNTER — Other Ambulatory Visit: Payer: Self-pay | Admitting: Nurse Practitioner

## 2021-04-03 DIAGNOSIS — F5104 Psychophysiologic insomnia: Secondary | ICD-10-CM

## 2021-04-18 ENCOUNTER — Ambulatory Visit (INDEPENDENT_AMBULATORY_CARE_PROVIDER_SITE_OTHER): Payer: Medicare Other | Admitting: Family

## 2021-04-18 ENCOUNTER — Encounter: Payer: Self-pay | Admitting: Family

## 2021-04-18 DIAGNOSIS — J019 Acute sinusitis, unspecified: Secondary | ICD-10-CM

## 2021-04-18 MED ORDER — DOXYCYCLINE HYCLATE 100 MG PO TABS: 100.0000 mg | ORAL_TABLET | Freq: Two times a day (BID) | ORAL | 0 refills | Status: DC

## 2021-04-18 NOTE — Patient Instructions (Signed)

## 2021-04-18 NOTE — Progress Notes (Signed)
Virtual Visit  Note Due to COVID-19 pandemic this visit was conducted virtually. This visit type was conducted due to national recommendations for restrictions regarding the COVID-19 Pandemic (e.g. social distancing, sheltering in place) in an effort to limit this patient's exposure and mitigate transmission in our community. All issues noted in this document were discussed and addressed.  A physical exam was not performed with this format.  I connected with Donzetta Sprung on 04/18/21 at 1:57 pm by telephone and verified that I am speaking with the correct person using two identifiers. Donzetta Sprung is currently located at home and no one is currently with him during visit. The provider, Jannifer Rodney, FNP is located in their office at time of visit.  I discussed the limitations, risks, security and privacy concerns of performing an evaluation and management service by telephone and the availability of in person appointments. I also discussed with the patient that there may be a patient responsible charge related to this service. The patient expressed understanding and agreed to proceed.  Mr. jordanny, [place] are scheduled for a virtual visit with your provider today.    Just as we do with appointments in the office, we must obtain your consent to participate.  Your consent will be active for this visit and any virtual visit you may have with one of our providers in the next 365 days.    If you have a MyChart account, I can also send a copy of this consent to you electronically.  All virtual visits are billed to your insurance company just like a traditional visit in the office.  As this is a virtual visit, video technology does not allow for your provider to perform a traditional examination.  This may limit your provider's ability to fully assess your condition.  If your provider identifies any concerns that need to be evaluated in person or the need to arrange testing such as labs, EKG, etc, we will  make arrangements to do so.    Although advances in technology are sophisticated, we cannot ensure that it will always work on either your end or our end.  If the connection with a video visit is poor, we may have to switch to a telephone visit.  With either a video or telephone visit, we are not always able to ensure that we have a secure connection.   I need to obtain your verbal consent now.   Are you willing to proceed with your visit today?   RUBEN MAHLER has provided verbal consent on 04/18/2021 for a virtual visit (video or telephone).   Jannifer Rodney, [place] 04/18/2021  1:58 PM    History and Present Illness:  Pt calls the office today with sinus problems over the last week that is worsening.  Sinusitis This is a new problem. The current episode started in the past 7 days. The problem has been gradually worsening since onset. There has been no fever. His pain is at a severity of 8/10. Associated symptoms include congestion, coughing, ear pain, headaches, a hoarse voice and sinus pressure. Pertinent negatives include no sneezing or sore throat. Past treatments include oral decongestants. The treatment provided mild relief.     Review of Systems  HENT:  Positive for congestion, ear pain, hoarse voice and sinus pressure. Negative for sneezing and sore throat.   Respiratory:  Positive for cough.   Neurological:  Positive for headaches.  All other systems reviewed and are negative.   Observations/Objective: No SOB or distress  noted, hoarse voice  Assessment and Plan: 1. Acute sinusitis, recurrence not specified, unspecified location - Take meds as prescribed - Use a cool mist humidifier  -Use saline nose sprays frequently -Force fluids -For any cough or congestion  Use plain Mucinex- regular strength or max strength is fine -For fever or aces or pains- take tylenol or ibuprofen. -Throat lozenges if help -Follow up if symptoms worsen or do not improve  - doxycycline (VIBRA-TABS)  100 MG tablet; Take 1 tablet (100 mg total) by mouth 2 (two) times daily.  Dispense: 20 tablet; Refill: 0  I discussed the assessment and treatment plan with the patient. The patient was provided an opportunity to ask questions and all were answered. The patient agreed with the plan and demonstrated an understanding of the instructions.   The patient was advised to call back or seek an in-person evaluation if the symptoms worsen or if the condition fails to improve as anticipated.  The above assessment and management plan was discussed with the patient. The patient verbalized understanding of and has agreed to the management plan. Patient is aware to call the clinic if symptoms persist or worsen. Patient is aware when to return to the clinic for a follow-up visit. Patient educated on when it is appropriate to go to the emergency department.   Time call ended: 2:08 pm    I provided 11 minutes of  non face-to-face time during this encounter.    Jannifer Rodney, FNP

## 2021-04-20 DIAGNOSIS — H905 Unspecified sensorineural hearing loss: Secondary | ICD-10-CM | POA: Diagnosis not present

## 2021-04-21 DIAGNOSIS — J449 Chronic obstructive pulmonary disease, unspecified: Secondary | ICD-10-CM | POA: Diagnosis not present

## 2021-04-24 ENCOUNTER — Ambulatory Visit (INDEPENDENT_AMBULATORY_CARE_PROVIDER_SITE_OTHER): Payer: Medicare Other

## 2021-04-24 ENCOUNTER — Other Ambulatory Visit: Payer: Self-pay

## 2021-04-24 ENCOUNTER — Telehealth: Payer: Self-pay

## 2021-04-24 VITALS — Wt 195.0 lb

## 2021-04-24 DIAGNOSIS — R2681 Unsteadiness on feet: Secondary | ICD-10-CM

## 2021-04-24 DIAGNOSIS — Z602 Problems related to living alone: Secondary | ICD-10-CM | POA: Diagnosis not present

## 2021-04-24 DIAGNOSIS — Z0001 Encounter for general adult medical examination with abnormal findings: Secondary | ICD-10-CM | POA: Diagnosis not present

## 2021-04-24 DIAGNOSIS — E1140 Type 2 diabetes mellitus with diabetic neuropathy, unspecified: Secondary | ICD-10-CM | POA: Diagnosis not present

## 2021-04-24 DIAGNOSIS — Z9181 History of falling: Secondary | ICD-10-CM | POA: Diagnosis not present

## 2021-04-24 DIAGNOSIS — Z0000 Encounter for general adult medical examination without abnormal findings: Secondary | ICD-10-CM

## 2021-04-24 NOTE — Telephone Encounter (Signed)
Please order elevated toilet seat

## 2021-04-24 NOTE — Telephone Encounter (Signed)
Rx printed for elevated toilet seat, a rolling walker, and a shower chair. Patient notified and faxed to [facility]

## 2021-04-24 NOTE — Addendum Note (Signed)
Addended by: Cleda Daub on: 04/24/2021 05:10 PM   Modules accepted: Orders

## 2021-04-24 NOTE — Patient Instructions (Signed)
[name] , Thank you for taking time to come for your Medicare Wellness Visit. I appreciate your ongoing commitment to your health goals. Please review the following plan we discussed and let me know if I can assist you in the future.   Screening recommendations/referrals: Colonoscopy: Declined Recommended yearly ophthalmology/optometry visit for glaucoma screening and checkup Recommended yearly dental visit for hygiene and checkup  Vaccinations: Influenza vaccine: Done 05/17/2020 - recommend repeat every fall *due Pneumococcal vaccine: Prevnar-13 06/05/2016 - due for Pneumovax-23 Tdap vaccine: Done 08/14/2016 - Repeat in 10 years Shingles vaccine: Done 06/05/2016 & 08/14/2016   Covid-19: Done 07/13/2019 & 08/17/2019  Advanced directives: Advance directive discussed with you today. Even though you declined this today, please call our office should you change your mind, and we can give you the proper paperwork for you to fill out.   Conditions/risks identified: Aim for 30 minutes of exercise or brisk walking each day, drink 6-8 glasses of water and eat lots of fruits and vegetables.   Next appointment: Follow up in one year for your annual wellness visit.   Preventive Care 26 Years and Older, Male  Preventive care refers to lifestyle choices and visits with your health care provider that can promote health and wellness. What does preventive care include? A yearly physical exam. This is also called an annual well check. Dental exams once or twice a year. Routine eye exams. Ask your health care provider how often you should have your eyes checked. Personal lifestyle choices, including: Daily care of your teeth and gums. Regular physical activity. Eating a healthy diet. Avoiding tobacco and drug use. Limiting alcohol use. Practicing safe sex. Taking low doses of aspirin every day. Taking vitamin and mineral supplements as recommended by your health care provider. What happens during an annual  well check? The services and screenings done by your health care provider during your annual well check will depend on your age, overall health, lifestyle risk factors, and family history of disease. Counseling  Your health care provider may ask you questions about your: Alcohol use. Tobacco use. Drug use. Emotional well-being. Home and relationship well-being. Sexual activity. Eating habits. History of falls. Memory and ability to understand (cognition). Work and work Astronomer. Screening  You may have the following tests or measurements: Height, weight, and BMI. Blood pressure. Lipid and cholesterol levels. These may be checked every 5 years, or more frequently if you are over 89 years old. Skin check. Lung cancer screening. You may have this screening every year starting at age 66 if you have a 30-pack-year history of smoking and currently smoke or have quit within the past 15 years. Fecal occult blood test (FOBT) of the stool. You may have this test every year starting at age 65. Flexible sigmoidoscopy or colonoscopy. You may have a sigmoidoscopy every 5 years or a colonoscopy every 10 years starting at age 32. Prostate cancer screening. Recommendations will vary depending on your family history and other risks. Hepatitis C blood test. Hepatitis B blood test. Sexually transmitted disease (STD) testing. Diabetes screening. This is done by checking your blood sugar (glucose) after you have not eaten for a while (fasting). You may have this done every 1-3 years. Abdominal aortic aneurysm (AAA) screening. You may need this if you are a current or former smoker. Osteoporosis. You may be screened starting at age 64 if you are at high risk. Talk with your health care provider about your test results, treatment options, and if necessary, the need for  more tests. Vaccines  Your health care provider may recommend certain vaccines, such as: Influenza vaccine. This is recommended every  year. Tetanus, diphtheria, and acellular pertussis (Tdap, Td) vaccine. You may need a Td booster every 10 years. Zoster vaccine. You may need this after age 61. Pneumococcal 13-valent conjugate (PCV13) vaccine. One dose is recommended after age 70. Pneumococcal polysaccharide (PPSV23) vaccine. One dose is recommended after age 26. Talk to your health care provider about which screenings and vaccines you need and how often you need them. This information is not intended to replace advice given to you by your health care provider. Make sure you discuss any questions you have with your health care provider. Document Released: 03/24/2015 Document Revised: 11/15/2015 Document Reviewed: 12/27/2014 Elsevier Interactive Patient Education  2017 [place] Prevention in the Home Falls can cause injuries. They can happen to people of all ages. There are many things you can do to make your home safe and to help prevent falls. What can I do on the outside of my home? Regularly fix the edges of walkways and driveways and fix any cracks. Remove anything that might make you trip as you walk through a door, such as a raised step or threshold. Trim any bushes or trees on the path to your home. Use bright outdoor lighting. Clear any walking paths of anything that might make someone trip, such as rocks or tools. Regularly check to see if handrails are loose or broken. Make sure that both sides of any steps have handrails. Any raised decks and porches should have guardrails on the edges. Have any leaves, snow, or ice cleared regularly. Use sand or salt on walking paths during winter. Clean up any spills in your garage right away. This includes oil or grease spills. What can I do in the bathroom? Use night lights. Install grab bars by the toilet and in the tub and shower. Do not use towel bars as grab bars. Use non-skid mats or decals in the tub or shower. If you need to sit down in the shower, use a  plastic, non-slip stool. Keep the floor dry. Clean up any water that spills on the floor as soon as it happens. Remove soap buildup in the tub or shower regularly. Attach bath mats securely with double-sided non-slip rug tape. Do not have throw rugs and other things on the floor that can make you trip. What can I do in the bedroom? Use night lights. Make sure that you have a light by your bed that is easy to reach. Do not use any sheets or blankets that are too big for your bed. They should not hang down onto the floor. Have a firm chair that has side arms. You can use this for support while you get dressed. Do not have throw rugs and other things on the floor that can make you trip. What can I do in the kitchen? Clean up any spills right away. Avoid walking on wet floors. Keep items that you use a lot in easy-to-reach places. If you need to reach something above you, use a strong step stool that has a grab bar. Keep electrical cords out of the way. Do not use floor polish or wax that makes floors slippery. If you must use wax, use non-skid floor wax. Do not have throw rugs and other things on the floor that can make you trip. What can I do with my stairs? Do not leave any items on the stairs. Make  sure that there are handrails on both sides of the stairs and use them. Fix handrails that are broken or loose. Make sure that handrails are as long as the stairways. Check any carpeting to make sure that it is firmly attached to the stairs. Fix any carpet that is loose or worn. Avoid having throw rugs at the top or bottom of the stairs. If you do have throw rugs, attach them to the floor with carpet tape. Make sure that you have a light switch at the top of the stairs and the bottom of the stairs. If you do not have them, ask someone to add them for you. What else can I do to help prevent falls? Wear shoes that: Do not have high heels. Have rubber bottoms. Are comfortable and fit you  well. Are closed at the toe. Do not wear sandals. If you use a stepladder: Make sure that it is fully opened. Do not climb a closed stepladder. Make sure that both sides of the stepladder are locked into place. Ask someone to hold it for you, if possible. Clearly mark and make sure that you can see: Any grab bars or handrails. First and last steps. Where the edge of each step is. Use tools that help you move around (mobility aids) if they are needed. These include: Canes. Walkers. Scooters. Crutches. Turn on the lights when you go into a dark area. Replace any light bulbs as soon as they burn out. Set up your furniture so you have a clear path. Avoid moving your furniture around. If any of your floors are uneven, fix them. If there are any pets around you, be aware of where they are. Review your medicines with your doctor. Some medicines can make you feel dizzy. This can increase your chance of falling. Ask your doctor what other things that you can do to help prevent falls. This information is not intended to replace advice given to you by your health care provider. Make sure you discuss any questions you have with your health care provider. Document Released: 12/22/2008 Document Revised: 08/03/2015 Document Reviewed: 04/01/2014 Elsevier Interactive Patient Education  2017 [employer].

## 2021-04-24 NOTE — Progress Notes (Signed)
Subjective:   Anthony Swanson is a 71 y.o. male who presents for Medicare Annual/Subsequent preventive examination.  Virtual Visit via Telephone Note  I connected with  Anthony Swanson on 04/24/21 at 12:00 PM EST by telephone and verified that I am speaking with the correct person using two identifiers.  Location: Patient: Home Provider: [facility] Persons participating in the virtual visit: patient/Nurse Health Advisor   I discussed the limitations, risks, security and privacy concerns of performing an evaluation and management service by telephone and the availability of in person appointments. The patient expressed understanding and agreed to proceed.  Interactive audio and video telecommunications were attempted between this nurse and patient, however failed, due to patient having technical difficulties OR patient did not have access to video capability.  We continued and completed visit with audio only.  Some vital signs may be absent or patient reported.   Breck Hollinger E Damisha Wolff, LPN   Review of Systems     Cardiac Risk Factors include: advanced age (>34mn, >>33women);male gender;dyslipidemia;obesity (BMI >30kg/m2);sedentary lifestyle;diabetes mellitus;Other (see comment), Risk factor comments: hx of MI     Objective:    Today's Vitals   04/24/21 1205  Weight: 195 lb (88.5 kg)   Body mass index is 31.47 kg/m.  Advanced Directives 04/24/2021  Does Patient Have a Medical Advance Directive? No  Would patient like information on creating a medical advance directive? No - Patient declined    Current Medications (verified) Outpatient Encounter Medications as of 04/24/2021  Medication Sig   ACCU-CHEK GUIDE test strip Test BS daily Dx E11.9   Accu-Chek Softclix Lancets lancets Test BS daily Dx E11.9   acetaminophen (TYLENOL) 500 MG tablet Take 500 mg by mouth every 6 (six) hours as needed. For pain   albuterol (PROVENTIL) (2.5 MG/3ML) 0.083% nebulizer solution SMARTSIG:1 Vial(s) Via  Nebulizer 4 Times Daily PRN   albuterol (VENTOLIN HFA) 108 (90 Base) MCG/ACT inhaler Inhale 2 puffs into the lungs every 6 (six) hours as needed. For shortness of breath   blood glucose meter kit and supplies Dispense based on patient and insurance preference. Use up to four times daily as directed. (FOR ICD-10 E10.9, E11.9).   Blood Glucose Monitoring Suppl (ACCU-CHEK GUIDE) w/Device KIT Test BS daily Dx E11.9   famotidine (PEPCID) 20 MG tablet TAKE 1 TABLET BY MOUTH EVERY DAY AFTER dinner   gabapentin (NEURONTIN) 600 MG tablet TAKE 1 TABLET BY MOUTH AT BEDTIME   glipiZIDE (GLUCOTROL) 10 MG tablet TAKE 1 TABLET BY MOUTH TWICE DAILY   hydrochlorothiazide (HYDRODIURIL) 25 MG tablet Take 1 tablet (25 mg total) by mouth daily.   JARDIANCE 25 MG TABS tablet TAKE 1 TABLET BY MOUTH DAILY BEFORE BREAKFAST   losartan (COZAAR) 50 MG tablet TAKE 1 TABLET BY MOUTH EVERY DAY   omeprazole (PRILOSEC) 40 MG capsule TAKE ONE CAPSULE BY MOUTH EVERY MORNING   simvastatin (ZOCOR) 20 MG tablet TAKE 1 TABLET BY MOUTH EVERY MORNING   vitamin C (ASCORBIC ACID) 500 MG tablet Take 500 mg by mouth daily.   lidocaine (XYLOCAINE) 5 % ointment Apply 1 application topically as needed. Use for back pain (Patient not taking: Reported on 04/24/2021)   Semaglutide (RYBELSUS) 3 MG TABS Take 3 mg by mouth daily. (Patient not taking: Reported on 04/24/2021)   traZODone (DESYREL) 50 MG tablet Take 25-50 mg by mouth at bedtime as needed. (Patient not taking: Reported on 04/24/2021)   [DISCONTINUED] doxycycline (VIBRA-TABS) 100 MG tablet Take 1 tablet (100 mg total) by  mouth 2 (two) times daily. (Patient not taking: Reported on 04/24/2021)   [DISCONTINUED] mirtazapine (REMERON) 7.5 MG tablet Take 1 tablet (7.5 mg total) by mouth at bedtime. (Patient not taking: Reported on 03/13/2021)   No facility-administered encounter medications on file as of 04/24/2021.    Allergies (verified) Codeine, Penicillins, Percocet [oxycodone-acetaminophen],  and Sulfa antibiotics   History: Past Medical History:  Diagnosis Date   Allergy    Arthritis    Asthma    Diabetes mellitus    Hyperlipidemia    Hypertension    Myocardial infarction [facility])    Past Surgical History:  Procedure Laterality Date   APPENDECTOMY  2020   Family History  Problem Relation Age of Onset   Heart attack Mother    Stroke Father    Cancer Brother    COPD Brother    Social History   Socioeconomic History   Marital status: Widowed    Spouse name: Not on file   Number of children: 4   Years of education: 8   Highest education level: 8th grade  Occupational History   Occupation: retired  Tobacco Use   Smoking status: Former    Packs/day: 1.00    Years: 6.00    Pack years: 6.00    Types: Cigarettes    Quit date: 1977    Years since quitting: 46.1   Smokeless tobacco: Current    Types: Nurse, children's Use: Never used  Substance and Sexual Activity   Alcohol use: No   Drug use: No   Sexual activity: Not Currently    Birth control/protection: None  Other Topics Concern   Not on file  Social History Narrative   Lives alone - no family around here   Goes to church   4 children - they don't visit much   Social Determinants of Health   Financial Resource Strain: Low Risk    Difficulty of Paying Living Expenses: Not very hard  Food Insecurity: No Food Insecurity   Worried About Charity fundraiser in the Last Year: Never true   Sigurd in the Last Year: Never true  Transportation Needs: No Transportation Needs   Lack of Transportation (Medical): No   Lack of Transportation (Non-Medical): No  Physical Activity: Insufficiently Active   Days of Exercise per Week: 7 days   Minutes of Exercise per Session: 10 min  Stress: No Stress Concern Present   Feeling of Stress : Only a little  Social Connections: Moderately Integrated   Frequency of Communication with Friends and Family: More than three times a week   Frequency of  Social Gatherings with Friends and Family: Once a week   Attends Religious Services: More than 4 times per year   Active Member of [employer] or Organizations: Yes   Attends Archivist Meetings: More than 4 times per year   Marital Status: Widowed    Tobacco Counseling Ready to quit: Not Answered Counseling given: Not Answered   Clinical Intake:  Pre-visit preparation completed: Yes  Pain : No/denies pain     BMI - recorded: 31.47 Nutritional Status: BMI > 30  Obese Nutritional Risks: None Diabetes: Yes CBG done?: No Did pt. bring in CBG monitor from home?: No  How often do you need to have someone help you when you read instructions, pamphlets, or other written materials from your doctor or pharmacy?: 1 - Never  Diabetic? Nutrition Risk Assessment:  Has the patient had any  N/V/D within the last 2 months?  No  Does the patient have any non-healing wounds?  No  Has the patient had any unintentional weight loss or weight gain?  No   Diabetes:  Is the patient diabetic?  Yes  If diabetic, was a CBG obtained today?  No  Did the patient bring in their glucometer from home?  No  How often do you monitor your CBG's? Whenever he thinks about it.   Financial Strains and Diabetes Management:  Are you having any financial strains with the device, your supplies or your medication? No .  Does the patient want to be seen by Chronic Care Management for management of their diabetes?  Yes  Would the patient like to be referred to a Nutritionist or for Diabetic Management?  No   Diabetic Exams:  Diabetic Eye Exam: Completed 07/13/2020.   Diabetic Foot Exam: Completed 06/15/2020. Pt has been advised about the importance in completing this exam. Pt is scheduled for diabetic foot exam on 05/2021.    Interpreter Needed?: No  Information entered by :: Niara Bunker, LPN   Activities of Daily Living In your present state of health, do you have any difficulty performing the following  activities: 04/24/2021  Hearing? Y  Comment wears hearing aids  Vision? N  Difficulty concentrating or making decisions? N  Walking or climbing stairs? Y  Dressing or bathing? N  Doing errands, shopping? Y  Comment doesn't drive - uses [employer] bus  Conservation officer, nature and eating ? N  Using the Toilet? N  In the past six months, have you accidently leaked urine? N  Do you have problems with loss of bowel control? N  Managing your Medications? N  Managing your Finances? N  Housekeeping or managing your Housekeeping? N  Some recent data might be hidden    Patient Care Team: Gwenlyn Perking, FNP as PCP - General (Family Medicine) Celestia Khat, OD (Optometry)  Indicate any recent Medical Services you may have received from other than [facility] providers in the past year (date may be approximate).     Assessment:   This is a routine wellness examination for Halvor.  Hearing/Vision screen Hearing Screening - Comments:: Wears hearing aids from [facility] in [place] - Comments:: Wears rx glasses - up to date with routine eye exams with [facility]  Dietary issues and exercise activities discussed: Current Exercise Habits: Home exercise routine, Type of exercise: walking, Time (Minutes): 10, Frequency (Times/Week): 7, Weekly Exercise (Minutes/Week): 70, Intensity: Mild, Exercise limited by: neurologic condition(s)   Goals Addressed             This Visit's Progress    LIFESTYLE - DECREASE FALLS RISK       Try to obtain walker with 4 wheels and seat, shower chair, elevated toilet seat, and life alert       Depression Screen PHQ 2/9 Scores 04/24/2021 03/08/2021 12/07/2020 10/23/2020 08/17/2020 07/12/2020 05/17/2020  PHQ - 2 Score 0 0 - - 0 0 1  PHQ- 9 Score 2 0 - - - - -  Exception Documentation - - Patient refusal Patient refusal - - -    Fall Risk Fall Risk  04/24/2021 03/08/2021 12/07/2020 10/23/2020 08/17/2020  Falls in the past year? 1 0 0 0 0  Number falls in past  yr: 0 - - - -  Injury with Fall? 0 - - - -  Risk for fall due to : History of fall(s);Orthopedic patient;Medication side effect;Other (Comment) - - - -  Risk for fall due to: Comment neuropathy - - - -  Follow up Education provided;Falls prevention discussed - - - -    FALL RISK PREVENTION PERTAINING TO THE HOME:  Any stairs in or around the home? No  If so, are there any without handrails? No  Home free of loose throw rugs in walkways, pet beds, electrical cords, etc? Yes  Adequate lighting in your home to reduce risk of falls? Yes   ASSISTIVE DEVICES UTILIZED TO PREVENT FALLS:  Life alert? No  Use of a cane, walker or w/c?  No, but needs one - asking for rx from pcp Grab bars in the bathroom? Yes  Shower chair or bench in shower? No  Elevated toilet seat or a handicapped toilet? No   TIMED UP AND GO:  Was the test performed? No . Telephonic visit  Cognitive Function:     6CIT Screen 04/24/2021  What Year? 0 points  What month? 0 points  What time? 0 points  Count back from 20 0 points  Months in reverse 4 points  Repeat phrase 0 points  Total Score 4    Immunizations Immunization History  Administered Date(s) Administered   Fluad Quad(high Dose 65+) 05/17/2020   Moderna Sars-Covid-2 Vaccination 07/13/2019, 08/17/2019   Pneumococcal Conjugate-13 06/05/2016   Tdap 01/18/1999, 01/02/2005, 08/14/2016, 03/10/2021   Zoster Recombinat (Shingrix) 06/05/2016, 08/14/2016    TDAP status: Up to date  Flu Vaccine status: Due, Education has been provided regarding the importance of this vaccine. Advised may receive this vaccine at local pharmacy or Health Dept. Aware to provide a copy of the vaccination record if obtained from local pharmacy or Health Dept. Verbalized acceptance and understanding.  Pneumococcal vaccine status: Due, Education has been provided regarding the importance of this vaccine. Advised may receive this vaccine at local pharmacy or Health Dept. Aware to  provide a copy of the vaccination record if obtained from local pharmacy or Health Dept. Verbalized acceptance and understanding.  Covid-19 vaccine status: Completed vaccines  Qualifies for Shingles Vaccine? Yes   Zostavax completed Yes   Shingrix Completed?: Yes  Screening Tests Health Maintenance  Topic Date Due   Pneumonia Vaccine 67+ Years old (2 - PPSV23 if available, else PCV20) 06/05/2017   INFLUENZA VACCINE  06/08/2021 (Originally 10/09/2020)   Hepatitis C Screening  07/12/2021 (Originally 11/10/1968)   COVID-19 Vaccine (3 - Booster for Moderna series) 07/28/2021 (Originally 10/12/2019)   FOOT EXAM  06/15/2021   OPHTHALMOLOGY EXAM  07/13/2021   HEMOGLOBIN A1C  09/06/2021   TETANUS/TDAP  03/11/2031   Zoster Vaccines- Shingrix  Completed   HPV VACCINES  Aged Out   COLONOSCOPY (Pts 45-31yr Insurance coverage will need to be confirmed)  Discontinued    Health Maintenance  Health Maintenance Due  Topic Date Due   Pneumonia Vaccine 72 Years old (2 - PPSV23 if available, else PCV20) 06/05/2017    Colorectal cancer screening: Declined  Lung Cancer Screening: (Low Dose CT Chest recommended if Age 72-80years, 30 pack-year currently smoking OR have quit w/in 15years.) does not qualify.  Additional Screening:  Hepatitis C Screening: does qualify; due  Vision Screening: Recommended annual ophthalmology exams for early detection of glaucoma and other disorders of the eye. Is the patient up to date with their annual eye exam?  Yes  Who is the provider or what is the name of the office in which the patient attends annual eye exams? [facility] pt is not established with a provider, would they  like to be referred to a provider to establish care? No .   Dental Screening: Recommended annual dental exams for proper oral hygiene  Community Resource Referral / Chronic Care Management: CRR required this visit?  No   CCM required this visit?  No      Plan:     I have  personally reviewed and noted the following in the patients chart:   Medical and social history Use of alcohol, tobacco or illicit drugs  Current medications and supplements including opioid prescriptions. Patient is not currently taking opioid prescriptions. Functional ability and status Nutritional status Physical activity Advanced directives List of other physicians Hospitalizations, surgeries, and ER visits in previous 12 months Vitals Screenings to include cognitive, depression, and falls Referrals and appointments  In addition, I have reviewed and discussed with patient certain preventive protocols, quality metrics, and best practice recommendations. A written personalized care plan for preventive services as well as general preventive health recommendations were provided to patient.     Sandrea Hammond, LPN   [id number]/76/7011   Nurse Notes: None

## 2021-04-24 NOTE — Telephone Encounter (Signed)
He has fallen once and has to hold the walls when his neuropathy is flared up, plus back hurts and he has a hard time standing for long. I also think he could benefit from elevated toilet seat and shower chair.

## 2021-04-26 ENCOUNTER — Telehealth: Payer: Self-pay

## 2021-04-26 NOTE — Chronic Care Management (AMB) (Signed)
Chronic Care Management   Note  04/26/2021 Name: DELOREAN KNUTZEN MRN: [id number] DOB: [birth year]  SEUNG NIDIFFER is a 71 y.o. year old male who is a primary care patient of Gwenlyn Perking, FNP. I reached out to [name] by phone today in response to a referral sent by Mr. KILIAN SCHWARTZ PCP.  Mr. Benham was given information about Chronic Care Management services today including:  CCM service includes personalized support from designated clinical staff supervised by his physician, including individualized plan of care and coordination with other care providers 24/7 contact phone numbers for assistance for urgent and routine care needs. Service will only be billed when office clinical staff spend 20 minutes or more in a month to coordinate care. Only one practitioner may furnish and bill the service in a calendar month. The patient may stop CCM services at any time (effective at the end of the month) by phone call to the office staff. The patient is responsible for co-pay (up to 20% after annual deductible is met) if co-pay is required by the individual health plan.   Patient agreed to services and verbal consent obtained.   Follow up plan: Telephone appointment with care management team member scheduled for: RN CM 05/01/2021 Pharm D 06/01/2021  Noreene Larsson, [place], [place],  [address] Direct Dial: [phone] [email or web address]

## 2021-04-27 NOTE — Telephone Encounter (Signed)
Appointment scheduled.

## 2021-04-30 ENCOUNTER — Telehealth: Payer: Self-pay

## 2021-04-30 ENCOUNTER — Ambulatory Visit (INDEPENDENT_AMBULATORY_CARE_PROVIDER_SITE_OTHER): Payer: Medicare Other | Admitting: Nurse Practitioner

## 2021-04-30 ENCOUNTER — Encounter: Payer: Self-pay | Admitting: Nurse Practitioner

## 2021-04-30 VITALS — BP 125/82 | HR 82 | Temp 98.1°F | Ht 66.0 in | Wt 198.0 lb

## 2021-04-30 DIAGNOSIS — J449 Chronic obstructive pulmonary disease, unspecified: Secondary | ICD-10-CM | POA: Diagnosis not present

## 2021-04-30 DIAGNOSIS — R2681 Unsteadiness on feet: Secondary | ICD-10-CM | POA: Diagnosis not present

## 2021-04-30 DIAGNOSIS — R296 Repeated falls: Secondary | ICD-10-CM | POA: Diagnosis not present

## 2021-04-30 DIAGNOSIS — W19XXXD Unspecified fall, subsequent encounter: Secondary | ICD-10-CM

## 2021-04-30 NOTE — Telephone Encounter (Signed)
° °  Telephone encounter was:  Successful.  04/30/2021 Name: Anthony Swanson MRN: [id number] DOB: [birth year]  Anthony Swanson is a 71 y.o. year old male who is a primary care patient of Gabriel Earing, FNP . The community resource team was consulted for assistance with  life alert  Care guide performed the following interventions: Patient provided with information about care guide support team and interviewed to confirm resource needs.Patient requested life alert information provided with [facility] and wanted to call on his own he didnt need assistance. i will follow up tomorrow   Follow Up Plan:  Care guide will follow up with patient by phone over the next day    [facility] Guide, Embedded Care Coordination [facility] [place], Care Management  [phone] [address] [place], [place], [place] [address] Phone: [phone] Email: [email or web address]

## 2021-04-30 NOTE — Progress Notes (Signed)
Established Patient Office Visit  Subjective:  Patient ID: Anthony Swanson, male    DOB: 03-30-1950  Age: 71 y.o. MRN: [id number]  CC:  Chief Complaint  Patient presents with   DME evaluation    Gilford Rile Shower chair    HPI Anthony Swanson presents for patient is a 71 year old male who presents to clinic today for a face-to-face to evaluate risk factors for increased falls.  Patient is requesting a walker and a shower chair.  Recently fell and has been unsteady ever since.  All injuries from recent fall has resolved.  Patient continues to be unsteady on his feet with unstable gait and worsening vision.  Past Medical History:  Diagnosis Date   Allergy    Arthritis    Asthma    Diabetes mellitus    Hyperlipidemia    Hypertension    Myocardial infarction [facility])     Past Surgical History:  Procedure Laterality Date   APPENDECTOMY  2020    Family History  Problem Relation Age of Onset   Heart attack Mother    Stroke Father    Cancer Brother    COPD Brother     Social History   Socioeconomic History   Marital status: Widowed    Spouse name: Not on file   Number of children: 4   Years of education: 8   Highest education level: 8th grade  Occupational History   Occupation: retired  Tobacco Use   Smoking status: Former    Packs/day: 1.00    Years: 6.00    Pack years: 6.00    Types: Cigarettes    Quit date: 1977    Years since quitting: 46.1   Smokeless tobacco: Current    Types: Nurse, children's Use: Never used  Substance and Sexual Activity   Alcohol use: No   Drug use: No   Sexual activity: Not Currently    Birth control/protection: None  Other Topics Concern   Not on file  Social History Narrative   Lives alone - no family around here   Goes to church   4 children - they don't visit much   Social Determinants of Health   Financial Resource Strain: Low Risk    Difficulty of Paying Living Expenses: Not very hard  Food Insecurity: No Food  Insecurity   Worried About Charity fundraiser in the Last Year: Never true   [place] in the Last Year: Never true  Transportation Needs: No Transportation Needs   Lack of Transportation (Medical): No   Lack of Transportation (Non-Medical): No  Physical Activity: Insufficiently Active   Days of Exercise per Week: 7 days   Minutes of Exercise per Session: 10 min  Stress: No Stress Concern Present   Feeling of Stress : Only a little  Social Connections: Moderately Integrated   Frequency of Communication with Friends and Family: More than three times a week   Frequency of Social Gatherings with Friends and Family: Once a week   Attends Religious Services: More than 4 times per year   Active Member of [employer] or Organizations: Yes   Attends Archivist Meetings: More than 4 times per year   Marital Status: Widowed  Human resources officer Violence: Not At Risk   Fear of Current or Ex-Partner: No   Emotionally Abused: No   Physically Abused: No   Sexually Abused: No    Outpatient Medications Prior to Visit  Medication  Sig Dispense Refill   ACCU-CHEK GUIDE test strip Test BS daily Dx E11.9 100 each 3   Accu-Chek Softclix Lancets lancets Test BS daily Dx E11.9 100 each 3   acetaminophen (TYLENOL) 500 MG tablet Take 500 mg by mouth every 6 (six) hours as needed. For pain     albuterol (PROVENTIL) (2.5 MG/3ML) 0.083% nebulizer solution SMARTSIG:1 Vial(s) Via Nebulizer 4 Times Daily PRN     albuterol (VENTOLIN HFA) 108 (90 Base) MCG/ACT inhaler Inhale 2 puffs into the lungs every 6 (six) hours as needed. For shortness of breath 18 g 5   blood glucose meter kit and supplies Dispense based on patient and insurance preference. Use up to four times daily as directed. (FOR ICD-10 E10.9, E11.9). 1 each 0   Blood Glucose Monitoring Suppl (ACCU-CHEK GUIDE) w/Device KIT Test BS daily Dx E11.9 1 kit 0   famotidine (PEPCID) 20 MG tablet TAKE 1 TABLET BY MOUTH EVERY DAY AFTER dinner 90 tablet 0    gabapentin (NEURONTIN) 600 MG tablet TAKE 1 TABLET BY MOUTH AT BEDTIME 90 tablet 0   glipiZIDE (GLUCOTROL) 10 MG tablet TAKE 1 TABLET BY MOUTH TWICE DAILY 180 tablet 0   hydrochlorothiazide (HYDRODIURIL) 25 MG tablet Take 1 tablet (25 mg total) by mouth daily. 90 tablet 3   JARDIANCE 25 MG TABS tablet TAKE 1 TABLET BY MOUTH DAILY BEFORE BREAKFAST 90 tablet 1   lidocaine (XYLOCAINE) 5 % ointment Apply 1 application topically as needed. Use for back pain     losartan (COZAAR) 50 MG tablet TAKE 1 TABLET BY MOUTH EVERY DAY 90 tablet 0   omeprazole (PRILOSEC) 40 MG capsule TAKE ONE CAPSULE BY MOUTH EVERY MORNING 90 capsule 0   Semaglutide (RYBELSUS) 3 MG TABS Take 3 mg by mouth daily. 30 tablet 0   simvastatin (ZOCOR) 20 MG tablet TAKE 1 TABLET BY MOUTH EVERY MORNING 90 tablet 0   traZODone (DESYREL) 50 MG tablet Take 25-50 mg by mouth at bedtime as needed.     vitamin C (ASCORBIC ACID) 500 MG tablet Take 500 mg by mouth daily.     No facility-administered medications prior to visit.    Allergies  Allergen Reactions   Codeine    Penicillins    Percocet [Oxycodone-Acetaminophen]    Sulfa Antibiotics     ROS Review of Systems  Constitutional: Negative.   HENT: Negative.    Eyes: Negative.   Cardiovascular: Negative.   Gastrointestinal: Negative.   Endocrine: Negative.   Genitourinary: Negative.   Musculoskeletal: Negative.   Skin: Negative.   Neurological: Negative.   Psychiatric/Behavioral: Negative.    All other systems reviewed and are negative.    Objective:    Physical Exam Vitals and nursing note reviewed.  Constitutional:      Appearance: Normal appearance. He is obese.  HENT:     Head: Normocephalic.     Nose: Nose normal.     Mouth/Throat:     Mouth: Mucous membranes are moist.     Pharynx: Oropharynx is clear.  Eyes:     Conjunctiva/sclera: Conjunctivae normal.  Cardiovascular:     Rate and Rhythm: Normal rate and regular rhythm.     Pulses: Normal pulses.      Heart sounds: Normal heart sounds.  Pulmonary:     Effort: Pulmonary effort is normal.     Breath sounds: Normal breath sounds.  Abdominal:     General: Bowel sounds are normal.  Musculoskeletal:        General:  Normal range of motion.  Skin:    General: Skin is warm.     Findings: No rash.  Neurological:     Mental Status: He is alert and oriented to person, place, and time.    BP 125/82    Pulse 82    Temp 98.1 F (36.7 C)    Ht _0  (1.676 m)    Wt 198 lb (89.8 kg)    SpO2 92%    BMI 31.96 kg/m  Wt Readings from Last 3 Encounters:  04/30/21 198 lb (89.8 kg)  04/24/21 195 lb (88.5 kg)  03/13/21 196 lb 6.4 oz (89.1 kg)     Health Maintenance Due  Topic Date Due   Pneumonia Vaccine 62+ Years old (2 - PPSV23 if available, else PCV20) 06/05/2017    There are no preventive care reminders to display for this patient.  No results found for: TSH Lab Results  Component Value Date   WBC 10.6 12/07/2020   HGB 16.3 12/07/2020   HCT 47.6 12/07/2020   MCV 87 12/07/2020   PLT 268 12/07/2020   Lab Results  Component Value Date   NA 136 03/13/2021   K 4.6 03/13/2021   CO2 23 03/13/2021   GLUCOSE 126 (H) 03/13/2021   BUN 15 03/13/2021   CREATININE 1.12 03/13/2021   BILITOT 0.4 12/07/2020   ALKPHOS 63 12/07/2020   AST 20 12/07/2020   ALT 29 12/07/2020   PROT 7.0 12/07/2020   ALBUMIN 4.9 (H) 12/07/2020   CALCIUM 9.8 03/13/2021   EGFR 70 03/13/2021   Lab Results  Component Value Date   CHOL 150 12/07/2020   Lab Results  Component Value Date   HDL 64 12/07/2020   Lab Results  Component Value Date   LDLCALC 71 12/07/2020   Lab Results  Component Value Date   TRIG 79 12/07/2020   Lab Results  Component Value Date   CHOLHDL 2.3 12/07/2020   Lab Results  Component Value Date   HGBA1C 8.6 (H) 03/08/2021      Assessment & Plan:  Patient's worsening unsteady gait.  Patient is at increased risk for falls.  Patient has had a previous fall with injury.   Patient's vision is very impaired and will need home safety to reduce repeat falls.  No other signs and symptoms associated with complaint. Education provided to patient on environmental safety and fall prevention at home.  Patient verbalized understanding. Problem List Items Addressed This Visit   None Visit Diagnoses     Fall, subsequent encounter    -  Primary   Relevant Orders   For home use only DME Other see comment       No orders of the defined types were placed in this encounter.   Follow-up: No follow-ups on file.    Ivy Lynn, NP

## 2021-05-01 ENCOUNTER — Telehealth: Payer: Self-pay

## 2021-05-01 ENCOUNTER — Telehealth: Payer: Self-pay | Admitting: Family Medicine

## 2021-05-01 ENCOUNTER — Other Ambulatory Visit: Payer: Self-pay | Admitting: Family Medicine

## 2021-05-01 ENCOUNTER — Encounter: Payer: Medicare Other | Admitting: *Deleted

## 2021-05-01 DIAGNOSIS — F5104 Psychophysiologic insomnia: Secondary | ICD-10-CM

## 2021-05-01 NOTE — Telephone Encounter (Signed)
° °  Telephone encounter was:  Successful.  05/01/2021 Name: Anthony Swanson MRN: [id number] DOB: 1950-09-24  Anthony Swanson is a 71 y.o. year old male who is a primary care patient of Gabriel Earing, FNP . The community resource team was consulted for assistance with  life alert   Care guide performed the following interventions: Patient provided with information about care guide support team and interviewed to confirm resource needs.Patient has been provided with life alert from his insurance  Follow Up Plan:  No further follow up planned at this time. The patient has been provided with needed resources.    Lenard Forth Care Guide, Embedded Care Coordination [facility], Care Management  [phone] [address] [place], [place], [place] [address] Phone: [phone] Email: [email or web address]

## 2021-05-02 NOTE — Telephone Encounter (Signed)
I spoke to pt's son and reviewed the office notes with him from his visit. Son has concerns about his father drinking and this may be why he is falling. Son also states he has concerns that pt is telling us one thing and the family another thing. Advised son he needs to come to appointments with pt and the son voiced understanding.

## 2021-05-04 ENCOUNTER — Telehealth: Payer: Self-pay | Admitting: Family Medicine

## 2021-05-04 DIAGNOSIS — Z9181 History of falling: Secondary | ICD-10-CM

## 2021-05-04 DIAGNOSIS — R2681 Unsteadiness on feet: Secondary | ICD-10-CM

## 2021-05-07 ENCOUNTER — Telehealth: Payer: Self-pay | Admitting: Family Medicine

## 2021-05-07 DIAGNOSIS — J4540 Moderate persistent asthma, uncomplicated: Secondary | ICD-10-CM

## 2021-05-07 MED ORDER — ALBUTEROL SULFATE (2.5 MG/3ML) 0.083% IN NEBU: INHALATION_SOLUTION | RESPIRATORY_TRACT | 2 refills | Status: DC

## 2021-05-07 NOTE — Telephone Encounter (Signed)
Pt aware DME order for rolling walker sent over but pt is requesting nebulizer refills and it doesn't look like we have ever filled the before. Ok to fill?

## 2021-05-07 NOTE — Telephone Encounter (Signed)
Pt aware rx sent into pharmacy. 

## 2021-05-07 NOTE — Telephone Encounter (Signed)
Albuterol neb Prescription sent to pharmacy.

## 2021-05-07 NOTE — Telephone Encounter (Signed)
I spoke to pt and he doesn't need a wheelchair he is wanting a rolling walker. I advised him I would get the order signed and faxed as requested.

## 2021-05-08 ENCOUNTER — Telehealth: Payer: Medicare Other | Admitting: *Deleted

## 2021-05-15 ENCOUNTER — Telehealth: Payer: Self-pay

## 2021-05-15 NOTE — Telephone Encounter (Signed)
? ?  Telephone encounter was:  Successful.  ?05/15/2021 ?Name: Anthony Swanson MRN: [id number] DOB: 1950-05-15 ? ?Anthony Swanson is a 71 y.o. year old male who is a primary care patient of Gwenlyn Perking, FNP . The community resource team was consulted for assistance with  life alert ? ?Care guide performed the following interventions: Patient provided with information about care guide support team and interviewed to confirm resource needs. Patient stated he did get life alert and its being mailed to him and he did get resouces I sent in the mail ? ?Follow Up Plan:  NO FOLLOW UP NEEDED ? ? ?Larena Sox ?Care Guide, Embedded Care Coordination ?[place], Care Management  ?[phone] ?[address], [place], [place] [address] ?Phone: [phone] ?Email: [email or web address] ? ?  ?

## 2021-05-15 NOTE — Telephone Encounter (Signed)
Anthony Swanson faxed on 05/07/21. Fax still on her desk if needed. Closing encounter ?

## 2021-05-21 ENCOUNTER — Other Ambulatory Visit: Payer: Self-pay | Admitting: Family Medicine

## 2021-05-21 DIAGNOSIS — E1140 Type 2 diabetes mellitus with diabetic neuropathy, unspecified: Secondary | ICD-10-CM

## 2021-05-23 DIAGNOSIS — H5213 Myopia, bilateral: Secondary | ICD-10-CM | POA: Diagnosis not present

## 2021-05-31 DIAGNOSIS — B351 Tinea unguium: Secondary | ICD-10-CM | POA: Diagnosis not present

## 2021-05-31 DIAGNOSIS — M79676 Pain in unspecified toe(s): Secondary | ICD-10-CM | POA: Diagnosis not present

## 2021-05-31 DIAGNOSIS — E1142 Type 2 diabetes mellitus with diabetic polyneuropathy: Secondary | ICD-10-CM | POA: Diagnosis not present

## 2021-05-31 DIAGNOSIS — L84 Corns and callosities: Secondary | ICD-10-CM | POA: Diagnosis not present

## 2021-06-01 ENCOUNTER — Ambulatory Visit: Payer: Medicare Other | Admitting: Pharmacist

## 2021-06-01 DIAGNOSIS — E1140 Type 2 diabetes mellitus with diabetic neuropathy, unspecified: Secondary | ICD-10-CM

## 2021-06-01 NOTE — Progress Notes (Signed)
?  Care Management  ? ?Follow Up Note ? ? ?06/01/2021 ?Name: Anthony Swanson MRN: [id number] DOB: 05-07-1950 ? ? ?Referred by: Gabriel Earing, FNP ?Reason for referral : Chronic Care Management and Diabetes ? ?Patient denies needing assistance with Rybelsus at this time.  Sample was left up front for patient to try.  He was not previously given rybelsus.  Encouraged patient to reach out to PharmD if needs arise ? ? ? ? ?Kieth Brightly, PharmD, BCPS ?Clinical Pharmacist, [facility] ?[place]  [facility] Phone [phone] ? ? ?

## 2021-06-07 ENCOUNTER — Ambulatory Visit: Payer: Medicare Other | Admitting: Family Medicine

## 2021-06-11 ENCOUNTER — Encounter: Payer: Self-pay | Admitting: Family Medicine

## 2021-06-11 ENCOUNTER — Ambulatory Visit (INDEPENDENT_AMBULATORY_CARE_PROVIDER_SITE_OTHER): Payer: Medicare Other | Admitting: Family Medicine

## 2021-06-11 VITALS — BP 107/70 | HR 87 | Temp 98.3°F | Ht 66.0 in | Wt 195.4 lb

## 2021-06-11 DIAGNOSIS — E1169 Type 2 diabetes mellitus with other specified complication: Secondary | ICD-10-CM

## 2021-06-11 DIAGNOSIS — I152 Hypertension secondary to endocrine disorders: Secondary | ICD-10-CM | POA: Diagnosis not present

## 2021-06-11 DIAGNOSIS — K219 Gastro-esophageal reflux disease without esophagitis: Secondary | ICD-10-CM

## 2021-06-11 DIAGNOSIS — E1140 Type 2 diabetes mellitus with diabetic neuropathy, unspecified: Secondary | ICD-10-CM | POA: Diagnosis not present

## 2021-06-11 DIAGNOSIS — E1159 Type 2 diabetes mellitus with other circulatory complications: Secondary | ICD-10-CM

## 2021-06-11 DIAGNOSIS — E785 Hyperlipidemia, unspecified: Secondary | ICD-10-CM | POA: Diagnosis not present

## 2021-06-11 LAB — BAYER DCA HB A1C WAIVED: HB A1C (BAYER DCA - WAIVED): 9.1 % — ABNORMAL HIGH (ref 4.8–5.6)

## 2021-06-11 MED ORDER — EMPAGLIFLOZIN 25 MG PO TABS: 25.0000 mg | ORAL_TABLET | Freq: Every day | ORAL | 3 refills | Status: DC

## 2021-06-11 MED ORDER — GABAPENTIN 600 MG PO TABS: 600.0000 mg | ORAL_TABLET | Freq: Two times a day (BID) | ORAL | 2 refills | Status: DC

## 2021-06-11 NOTE — Progress Notes (Signed)
? ?Established Patient Office Visit ? ?Subjective:  ?Patient ID: Anthony Swanson, male    DOB: [birth year]  Age: 71 y.o. MRN: [id number] ? ?CC:  ?Chief Complaint  ?Patient presents with  ? Medical Management of Chronic Issues  ? Diabetes  ? ? ?HPI ?Anthony Swanson presents for chronic follow up. ? ?DM ?Patient denies foot ulcerations, nausea, polydipsia, polyuria, visual disturbances, vomiting, and weight loss. ? ?Current diabetic medications include glipizide 10 mg BID, jardiance 25 mg ?Compliant with meds - Yes ? ?Reports continue pain in bilateral legs. Takes gabapentin just at night.  ? ?He has not picked up rybelsus to start yet.  ? ?Is He on ACE inhibitor or angiotensin II receptor blocker?  ?Yes, losartan ?Is He on statin? Yes simvastatin ? ?2. HTN ?Complaint with meds - Yes ?Current Medications - losartan 50 mg, HCTZ 25 mg ?Pertinent ROS:  ?Headache - No ?Fatigue - No ?Visual Disturbances - No ?Chest pain - No ?Dyspnea - No ?Palpitations - No ?LE edema - No ?They report good compliance with medications and can restate their regimen by memory. No medication side effects. ? ?Family, social, and smoking history reviewed.  ? ?BP Readings from Last 3 Encounters:  ?06/11/21 (!) 98/54  ?04/30/21 125/82  ?03/13/21 133/84  ? ? ?  Latest Ref Rng & Units 03/13/2021  ?  2:34 PM 03/08/2021  ? 12:25 PM 12/07/2020  ?  1:05 PM  ?CMP  ?Glucose 70 - 99 mg/dL 126   312   134    ?BUN 8 - 27 mg/dL _0 ?Creatinine 0.76 - 1.27 mg/dL 1.12   1.28   1.12    ?Sodium 134 - 144 mmol/L 136   136   138    ?Potassium 3.5 - 5.2 mmol/L 4.6   4.3   4.2    ?Chloride 96 - 106 mmol/L 99   98   97    ?CO2 20 - 29 mmol/L _1 ?Calcium 8.6 - 10.2 mg/dL 9.8   9.7   10.4    ?Total Protein 6.0 - 8.5 g/dL   7.0    ?Total Bilirubin 0.0 - 1.2 mg/dL   0.4    ?Alkaline Phos 44 - 121 IU/L   63    ?AST 0 - 40 IU/L   20    ?ALT 0 - 44 IU/L   29    ?  ? ? ? ?Past Medical History:  ?Diagnosis Date  ? Allergy   ? Arthritis   ? Asthma   ? Diabetes  mellitus   ? Hyperlipidemia   ? Hypertension   ? Myocardial infarction [facility])   ? ? ?Past Surgical History:  ?Procedure Laterality Date  ? APPENDECTOMY  2020  ? ? ?Family History  ?Problem Relation Age of Onset  ? Heart attack Mother   ? Stroke Father   ? Cancer Brother   ? COPD Brother   ? ? ?Social History  ? ?Socioeconomic History  ? Marital status: Widowed  ?  Spouse name: Not on file  ? Number of children: 4  ? Years of education: 8  ? Highest education level: 8th grade  ?Occupational History  ? Occupation: retired  ?Tobacco Use  ? Smoking status: Former  ?  Packs/day: 1.00  ?  Years: 6.00  ?  Pack years: 6.00  ?  Types: Cigarettes  ?  Quit date: 68  ?  Years since quitting: 46.2  ? Smokeless tobacco: Current  ?  Types: Chew  ?Vaping Use  ? Vaping Use: Never used  ?Substance and Sexual Activity  ? Alcohol use: No  ? Drug use: No  ? Sexual activity: Not Currently  ?  Birth control/protection: None  ?Other Topics Concern  ? Not on file  ?Social History Narrative  ? Lives alone - no family around here  ? Goes to church  ? 4 children - they don't visit much  ? ?Social Determinants of Health  ? ?Financial Resource Strain: Low Risk   ? Difficulty of Paying Living Expenses: Not very hard  ?Food Insecurity: No Food Insecurity  ? Worried About Charity fundraiser in the Last Year: Never true  ? Ran Out of Food in the Last Year: Never true  ?Transportation Needs: No Transportation Needs  ? Lack of Transportation (Medical): No  ? Lack of Transportation (Non-Medical): No  ?Physical Activity: Insufficiently Active  ? Days of Exercise per Week: 7 days  ? Minutes of Exercise per Session: 10 min  ?Stress: No Stress Concern Present  ? Feeling of Stress : Only a little  ?Social Connections: Moderately Integrated  ? Frequency of Communication with Friends and Family: More than three times a week  ? Frequency of Social Gatherings with Friends and Family: Once a week  ? Attends Religious Services: More than 4 times per year  ?  Active Member of Clubs or Organizations: Yes  ? Attends Archivist Meetings: More than 4 times per year  ? Marital Status: Widowed  ?Intimate Partner Violence: Not At Risk  ? Fear of Current or Ex-Partner: No  ? Emotionally Abused: No  ? Physically Abused: No  ? Sexually Abused: No  ? ? ?Outpatient Medications Prior to Visit  ?Medication Sig Dispense Refill  ? ACCU-CHEK GUIDE test strip Test BS daily Dx E11.9 100 each 3  ? Accu-Chek Softclix Lancets lancets Test BS daily Dx E11.9 100 each 3  ? acetaminophen (TYLENOL) 500 MG tablet Take 500 mg by mouth every 6 (six) hours as needed. For pain    ? albuterol (PROVENTIL) (2.5 MG/3ML) 0.083% nebulizer solution SMARTSIG:1 Vial(s) Via Nebulizer 4 Times Daily PRN 75 mL 2  ? albuterol (VENTOLIN HFA) 108 (90 Base) MCG/ACT inhaler Inhale 2 puffs into the lungs every 6 (six) hours as needed. For shortness of breath 18 g 5  ? blood glucose meter kit and supplies Dispense based on patient and insurance preference. Use up to four times daily as directed. (FOR ICD-10 E10.9, E11.9). 1 each 0  ? Blood Glucose Monitoring Suppl (ACCU-CHEK GUIDE) w/Device KIT Test BS daily Dx E11.9 1 kit 0  ? famotidine (PEPCID) 20 MG tablet TAKE 1 TABLET BY MOUTH EVERY DAY AFTER dinner 90 tablet 0  ? gabapentin (NEURONTIN) 600 MG tablet TAKE 1 TABLET BY MOUTH AT BEDTIME 90 tablet 0  ? glipiZIDE (GLUCOTROL) 10 MG tablet TAKE 1 TABLET BY MOUTH TWICE DAILY 180 tablet 0  ? hydrochlorothiazide (HYDRODIURIL) 25 MG tablet Take 1 tablet (25 mg total) by mouth daily. 90 tablet 3  ? JARDIANCE 25 MG TABS tablet TAKE 1 TABLET BY MOUTH DAILY BEFORE BREAKFAST 90 tablet 1  ? lidocaine (XYLOCAINE) 5 % ointment Apply 1 application topically as needed. Use for back pain    ? losartan (COZAAR) 50 MG tablet TAKE 1 TABLET BY MOUTH EVERY DAY 90 tablet 0  ? omeprazole (PRILOSEC) 40 MG capsule TAKE ONE CAPSULE BY MOUTH  EVERY MORNING 90 capsule 0  ? Semaglutide (RYBELSUS) 3 MG TABS Take 3 mg by mouth daily. 30 tablet  0  ? simvastatin (ZOCOR) 20 MG tablet TAKE 1 TABLET BY MOUTH EVERY MORNING 90 tablet 0  ? traZODone (DESYREL) 50 MG tablet Take 25-50 mg by mouth at bedtime as needed.    ? vitamin C (ASCORBIC ACID) 500 MG tablet Take 500 mg by mouth daily.    ? ?No facility-administered medications prior to visit.  ? ? ?Allergies  ?Allergen Reactions  ? Codeine   ? Penicillins   ? Percocet [Oxycodone-Acetaminophen]   ? Sulfa Antibiotics   ? ? ?ROS ?Review of Systems ?As per HPI.  ?  ?Objective:  ?  ?Physical Exam ?Vitals and nursing note reviewed.  ?Constitutional:   ?   General: He is not in acute distress. ?   Appearance: He is not ill-appearing, toxic-appearing or diaphoretic.  ?HENT:  ?   Head: Normocephalic and atraumatic.  ?   Nose: Nose normal.  ?   Mouth/Throat:  ?   Mouth: Mucous membranes are moist.  ?   Pharynx: Oropharynx is clear.  ?Eyes:  ?   Extraocular Movements: Extraocular movements intact.  ?   Pupils: Pupils are equal, round, and reactive to light.  ?Cardiovascular:  ?   Rate and Rhythm: Normal rate and regular rhythm.  ?   Pulses: Normal pulses.  ?   Heart sounds: Normal heart sounds. No murmur heard. ?Pulmonary:  ?   Effort: Pulmonary effort is normal. No respiratory distress.  ?   Breath sounds: Normal breath sounds.  ?Abdominal:  ?   General: Bowel sounds are normal. There is no distension.  ?   Palpations: Abdomen is soft.  ?   Tenderness: There is no abdominal tenderness. There is no guarding or rebound.  ?Musculoskeletal:  ?   Right lower leg: No edema.  ?   Left lower leg: No edema.  ?Skin: ?   General: Skin is warm and dry.  ?Neurological:  ?   General: No focal deficit present.  ?   Mental Status: He is alert and oriented to person, place, and time.  ?Psychiatric:     ?   Mood and Affect: Mood normal.     ?   Behavior: Behavior normal.     ?   Thought Content: Thought content normal.     ?   Judgment: Judgment normal.  ? ? ?BP (!) 98/54   Pulse 87   Temp 98.3 ?F (36.8 ?C) (Temporal)   Ht _0   (1.676 m)   Wt 195 lb 6 oz (88.6 kg)   BMI 31.53 kg/m?  ?Wt Readings from Last 3 Encounters:  ?06/11/21 195 lb 6 oz (88.6 kg)  ?04/30/21 198 lb (89.8 kg)  ?04/24/21 195 lb (88.5 kg)  ? ? ? ?Health Maintenance D

## 2021-06-11 NOTE — Patient Instructions (Signed)

## 2021-06-12 ENCOUNTER — Other Ambulatory Visit: Payer: Self-pay | Admitting: *Deleted

## 2021-06-12 ENCOUNTER — Telehealth: Payer: Self-pay | Admitting: Family Medicine

## 2021-06-12 DIAGNOSIS — E1159 Type 2 diabetes mellitus with other circulatory complications: Secondary | ICD-10-CM

## 2021-06-12 DIAGNOSIS — E1140 Type 2 diabetes mellitus with diabetic neuropathy, unspecified: Secondary | ICD-10-CM

## 2021-06-12 DIAGNOSIS — E1169 Type 2 diabetes mellitus with other specified complication: Secondary | ICD-10-CM

## 2021-06-12 LAB — CBC WITH DIFFERENTIAL/PLATELET
Basophils Absolute: 0 10*3/uL (ref 0.0–0.2)
Basos: 0 %
EOS (ABSOLUTE): 0.1 10*3/uL (ref 0.0–0.4)
Eos: 1 %
Hematocrit: 47.6 % (ref 37.5–51.0)
Hemoglobin: 15.9 g/dL (ref 13.0–17.7)
Immature Grans (Abs): 0 10*3/uL (ref 0.0–0.1)
Immature Granulocytes: 1 %
Lymphocytes Absolute: 1.3 10*3/uL (ref 0.7–3.1)
Lymphs: 15 %
MCH: 29.6 pg (ref 26.6–33.0)
MCHC: 33.4 g/dL (ref 31.5–35.7)
MCV: 89 fL (ref 79–97)
Monocytes Absolute: 0.6 10*3/uL (ref 0.1–0.9)
Monocytes: 7 %
Neutrophils Absolute: 6.4 10*3/uL (ref 1.4–7.0)
Neutrophils: 76 %
Platelets: 247 10*3/uL (ref 150–450)
RBC: 5.38 x10E6/uL (ref 4.14–5.80)
RDW: 13.1 % (ref 11.6–15.4)
WBC: 8.4 10*3/uL (ref 3.4–10.8)

## 2021-06-12 LAB — CMP14+EGFR
ALT: 30 IU/L (ref 0–44)
AST: 26 IU/L (ref 0–40)
Albumin/Globulin Ratio: 1.9 (ref 1.2–2.2)
Albumin: 4.6 g/dL (ref 3.7–4.7)
Alkaline Phosphatase: 66 IU/L (ref 44–121)
BUN/Creatinine Ratio: 10 (ref 10–24)
BUN: 12 mg/dL (ref 8–27)
Bilirubin Total: 0.3 mg/dL (ref 0.0–1.2)
CO2: 22 mmol/L (ref 20–29)
Calcium: 9.4 mg/dL (ref 8.6–10.2)
Chloride: 102 mmol/L (ref 96–106)
Creatinine, Ser: 1.26 mg/dL (ref 0.76–1.27)
Globulin, Total: 2.4 g/dL (ref 1.5–4.5)
Glucose: 266 mg/dL — ABNORMAL HIGH (ref 70–99)
Potassium: 4.2 mmol/L (ref 3.5–5.2)
Sodium: 142 mmol/L (ref 134–144)
Total Protein: 7 g/dL (ref 6.0–8.5)
eGFR: 61 mL/min/{1.73_m2} (ref >59)

## 2021-06-12 LAB — LIPID PANEL
Chol/HDL Ratio: 2.8 ratio (ref 0.0–5.0)
Cholesterol, Total: 142 mg/dL (ref 100–199)
HDL: 51 mg/dL (ref >39)
LDL Chol Calc (NIH): 59 mg/dL (ref 0–99)
Triglycerides: 193 mg/dL — ABNORMAL HIGH (ref 0–149)
VLDL Cholesterol Cal: 32 mg/dL (ref 5–40)

## 2021-06-12 NOTE — Telephone Encounter (Signed)
?  Prescription Request ? ?06/12/2021 ? ?Is this a "Controlled Substance" medicine? no ? ?Have you seen your PCP in the last 2 weeks? no ? ?If YES, route message to pool  -  If NO, patient needs to be scheduled for appointment. ? ?What is the name of the medication or equipment? empagliflozin (JARDIANCE) 25 MG TABS tablet ? ?Have you contacted your pharmacy to request a refill? Yes, he said they did not receive   ? ?Which pharmacy would you like this sent to? [facility] - [place], [place] - [address] (Ph: [phone]) ? ? ?Patient notified that their request is being sent to the clinical staff for review and that they should receive a response within 2 business days.  ?  ?

## 2021-06-12 NOTE — Telephone Encounter (Signed)
Patient aware that rx sent to pharmacy. 

## 2021-06-12 NOTE — Telephone Encounter (Signed)
Patient says that sodium chloride irrigation was delivered to his house and he does not know what it is for ?

## 2021-06-12 NOTE — Telephone Encounter (Signed)
Patient needs assistance with medication, he is confused about how to use. Please call back.  ?

## 2021-06-13 ENCOUNTER — Other Ambulatory Visit: Payer: Self-pay | Admitting: Family Medicine

## 2021-06-13 DIAGNOSIS — E1169 Type 2 diabetes mellitus with other specified complication: Secondary | ICD-10-CM

## 2021-06-13 NOTE — Telephone Encounter (Signed)
I spoke to pt and advised this was given to him at his visit early in feb due to illness and he doesn't have to use it. Pt voiced understanding. ?

## 2021-06-13 NOTE — Telephone Encounter (Signed)
I would encourage him to give it a try for awhile longer to see if the side effects ease up. I have also placed a referral to Raynelle Fanning to help with DM management.  ?

## 2021-06-13 NOTE — Telephone Encounter (Signed)
Pt aware of provider feedback and voiced understanding. 

## 2021-06-13 NOTE — Telephone Encounter (Signed)
I am not sure what the sodium chloride is for either. I did not order this for him.  ?

## 2021-06-20 ENCOUNTER — Telehealth: Payer: Self-pay | Admitting: Family Medicine

## 2021-06-20 NOTE — Telephone Encounter (Signed)
Explained to pt that there needs to be documentation during a visit with a foot exam, which I did not see at last weeks visit. Pt is ok with me adding this note to his 07/11/21 visit and waiting till then. ?

## 2021-06-22 ENCOUNTER — Other Ambulatory Visit: Payer: Self-pay | Admitting: Nurse Practitioner

## 2021-06-22 ENCOUNTER — Other Ambulatory Visit: Payer: Self-pay | Admitting: Family Medicine

## 2021-06-22 DIAGNOSIS — E1159 Type 2 diabetes mellitus with other circulatory complications: Secondary | ICD-10-CM

## 2021-06-22 DIAGNOSIS — E119 Type 2 diabetes mellitus without complications: Secondary | ICD-10-CM

## 2021-06-22 DIAGNOSIS — K219 Gastro-esophageal reflux disease without esophagitis: Secondary | ICD-10-CM

## 2021-06-22 DIAGNOSIS — E1169 Type 2 diabetes mellitus with other specified complication: Secondary | ICD-10-CM

## 2021-06-26 ENCOUNTER — Telehealth: Payer: Self-pay

## 2021-06-26 NOTE — Chronic Care Management (AMB) (Signed)
?  Chronic Care Management  ? ?Note ? ?06/26/2021 ?Name: Anthony Swanson MRN: [id number] DOB: 06-14-1950 ? ?Anthony Swanson is a 71 y.o. year old male who is a primary care patient of Gabriel Earing, FNP. Anthony Swanson is currently enrolled in care management services. An additional referral for Pharm D  was placed.  ? ?Follow up plan: ?The care management team will reach out to the patient again over the next 1 days.  ?If patient returns call to provider office, please advise to call Embedded Care Management Care Guide Batu Cassin  at [phone] ? ?Penne Lash, RMA ?Care Guide, Embedded Care Coordination ?[facility]  Care Management  ?[place], [place] [address] ?Direct Dial: [phone] ?[email or web address] ?Website: [email or web address]  ? ?

## 2021-07-03 ENCOUNTER — Other Ambulatory Visit: Payer: Self-pay | Admitting: Family Medicine

## 2021-07-03 DIAGNOSIS — J4540 Moderate persistent asthma, uncomplicated: Secondary | ICD-10-CM

## 2021-07-04 NOTE — Chronic Care Management (AMB) (Signed)
?  Chronic Care Management  ? ?Note ? ?07/04/2021 ?Name: [name] MRN: [id number] DOB: November 14, 1950 ? ?Anthony Swanson is a 71 y.o. year old male who is a primary care patient of Gabriel Earing, FNP. SAMANTHA OLIVERA is currently enrolled in care management services. An additional referral for Pharm D  was placed.  ? ?Follow up plan: ?Telephone appointment with care management team member scheduled for:07/20/2021 ? ?Penne Lash, RMA ?Care Guide, Embedded Care Coordination ?[facility]  Care Management  ?[place], [place] [address] ?Direct Dial: [phone] ?[email or web address] ?[email or web address]: [email or web address]  ? ?

## 2021-07-11 ENCOUNTER — Ambulatory Visit: Payer: Medicare Other | Admitting: Family Medicine

## 2021-07-12 ENCOUNTER — Ambulatory Visit (INDEPENDENT_AMBULATORY_CARE_PROVIDER_SITE_OTHER): Payer: Medicare Other | Admitting: Family Medicine

## 2021-07-12 ENCOUNTER — Encounter: Payer: Self-pay | Admitting: Family Medicine

## 2021-07-12 VITALS — BP 96/65 | HR 95 | Temp 98.6°F | Ht 66.0 in | Wt 195.0 lb

## 2021-07-12 DIAGNOSIS — I152 Hypertension secondary to endocrine disorders: Secondary | ICD-10-CM | POA: Diagnosis not present

## 2021-07-12 DIAGNOSIS — E1140 Type 2 diabetes mellitus with diabetic neuropathy, unspecified: Secondary | ICD-10-CM | POA: Diagnosis not present

## 2021-07-12 DIAGNOSIS — L84 Corns and callosities: Secondary | ICD-10-CM

## 2021-07-12 DIAGNOSIS — E1159 Type 2 diabetes mellitus with other circulatory complications: Secondary | ICD-10-CM

## 2021-07-12 MED ORDER — RYBELSUS 7 MG PO TABS: 7.0000 mg | ORAL_TABLET | Freq: Every day | ORAL | 1 refills | Status: DC

## 2021-07-12 MED ORDER — HYDROCHLOROTHIAZIDE 12.5 MG PO TABS: 12.5000 mg | ORAL_TABLET | Freq: Every day | ORAL | 3 refills | Status: DC

## 2021-07-12 NOTE — Progress Notes (Signed)
? ?Established Patient Office Visit ? ?Subjective   ?Patient ID: Anthony Swanson, male    DOB: [birth year]  Age: 71 y.o. MRN: [id number] ? ?Chief Complaint  ?Patient presents with  ? Diabetes  ?  4 week follow up , medication has been doing well , would like a refill on rebelsus  ? ? ?HPI ? ?Anthony Swanson is here for a follow of DM. He was started on rybelsus 3 mg at his last visit. He has been on this for 1 month and reports doing well without side effects. He is in need of another pair of diabetic shoes as the sole is coming off of the ones that he currently has. He will have an appt with podiatry coming up soon.  ? ?His BP is soft today and was at his last visit as well. He does not check his BP at home. He reports occasionally feeling lightheaded when standing. Denies other symptoms.  ? ?Patient Active Problem List  ? Diagnosis Date Noted  ? Chronic midline low back pain without sciatica 10/23/2020  ? Finger laceration 07/20/2020  ? Type 2 diabetes mellitus with diabetic neuropathy, without long-term current use of insulin ([place]) 05/17/2020  ? Asthma 05/17/2020  ? Hyperlipidemia associated with type 2 diabetes mellitus ([place]) 05/17/2020  ? Hypertension associated with diabetes ([place]) 05/17/2020  ? History of myocardial infarction 05/17/2020  ? Arthritis 05/17/2020  ? Allergies 05/17/2020  ? Gastroesophageal reflux disease without esophagitis 05/17/2020  ? Insomnia 05/17/2020  ? ?Past Medical History:  ?Diagnosis Date  ? Allergy   ? Arthritis   ? Asthma   ? Diabetes mellitus   ? Hyperlipidemia   ? Hypertension   ? Myocardial infarction [facility])   ? ?  ? ?Review of Systems  ?Constitutional:  Negative for chills, diaphoresis, fever, malaise/fatigue and weight loss.  ?Respiratory:  Negative for shortness of breath and wheezing.   ?Cardiovascular:  Negative for chest pain, palpitations, orthopnea, claudication and leg swelling.  ?Neurological:  Negative for tremors, focal weakness, weakness and headaches.  ? ?  ?Objective:  ?  ? ?BP  96/65   Pulse 95   Temp 98.6 ?F (37 ?C) (Temporal)   Ht 5' 6"  (1.676 m)   Wt 195 lb (88.5 kg)   SpO2 93%   BMI 31.47 kg/m?  ?BP Readings from Last 3 Encounters:  ?07/12/21 96/65  ?06/11/21 107/70  ?04/30/21 125/82  ? ?  ? ?Physical Exam ?Vitals and nursing note reviewed.  ?Constitutional:   ?   General: He is not in acute distress. ?   Appearance: He is not ill-appearing, toxic-appearing or diaphoretic.  ?HENT:  ?   Nose: Nose normal.  ?   Mouth/Throat:  ?   Mouth: Mucous membranes are moist.  ?   Pharynx: Oropharynx is clear.  ?Cardiovascular:  ?   Rate and Rhythm: Normal rate and regular rhythm.  ?   Heart sounds: Normal heart sounds. No murmur heard. ?Pulmonary:  ?   Effort: Pulmonary effort is normal. No respiratory distress.  ?   Breath sounds: Normal breath sounds.  ?Abdominal:  ?   General: Bowel sounds are normal. There is no distension.  ?   Palpations: Abdomen is soft.  ?   Tenderness: There is no abdominal tenderness. There is no guarding or rebound.  ?Musculoskeletal:  ?   Right lower leg: No edema.  ?   Left lower leg: No edema.  ?Skin: ?   General: Skin is warm and dry.  ?Neurological:  ?  Mental Status: He is alert and oriented to person, place, and time. Mental status is at baseline.  ?Psychiatric:     ?   Mood and Affect: Mood normal.     ?   Behavior: Behavior normal.  ? ? ?Diabetic Foot Exam - Simple   ?Simple Foot Form ?Diabetic Foot exam was performed with the following findings: Yes 07/12/2021  1:22 PM  ?Visual Inspection ?See comments: Yes ?Sensation Testing ?Intact to touch and monofilament testing bilaterally: Yes ?Pulse Check ?Posterior Tibialis and Dorsalis pulse intact bilaterally: Yes ?Comments ?Calluses and dry skin present bilaterally.  ?  ? ? ? ?No results found for any visits on 07/12/21. ? ?Last CBC ?Lab Results  ?Component Value Date  ? WBC 8.4 06/11/2021  ? HGB 15.9 06/11/2021  ? HCT 47.6 06/11/2021  ? MCV 89 06/11/2021  ? MCH 29.6 06/11/2021  ? RDW 13.1 06/11/2021  ? PLT 247  06/11/2021  ? ?Last metabolic panel ?Lab Results  ?Component Value Date  ? GLUCOSE 266 (H) 06/11/2021  ? NA 142 06/11/2021  ? K 4.2 06/11/2021  ? CL 102 06/11/2021  ? CO2 22 06/11/2021  ? BUN 12 06/11/2021  ? CREATININE 1.26 06/11/2021  ? EGFR 61 06/11/2021  ? CALCIUM 9.4 06/11/2021  ? PROT 7.0 06/11/2021  ? ALBUMIN 4.6 06/11/2021  ? LABGLOB 2.4 06/11/2021  ? AGRATIO 1.9 06/11/2021  ? BILITOT 0.3 06/11/2021  ? ALKPHOS 66 06/11/2021  ? AST 26 06/11/2021  ? ALT 30 06/11/2021  ? ?Last lipids ?Lab Results  ?Component Value Date  ? CHOL 142 06/11/2021  ? HDL 51 06/11/2021  ? LDLCALC 59 06/11/2021  ? TRIG 193 (H) 06/11/2021  ? CHOLHDL 2.8 06/11/2021  ? ?Last hemoglobin A1c ?Lab Results  ?Component Value Date  ? HGBA1C 9.1 (H) 06/11/2021  ? ?  ? ?The ASCVD Risk score (Arnett DK, et al., 2019) failed to calculate for the following reasons: ?  The patient has a prior MI or stroke diagnosis ? ?  ?Assessment & Plan:  ? ?Anthony Swanson was seen today for diabetes. ? ?Diagnoses and all orders for this visit: ? ?Type 2 diabetes mellitus with diabetic neuropathy, without long-term current use of insulin ([place]) ?Increase rybelsus to 7 mg daily. Foot exam today with calluses- will fax rx for diabetic shoe to [facility] as requested.  ?-     Semaglutide (RYBELSUS) 7 MG TABS; Take 7 mg by mouth daily. ?-     Cancel: For Home Use Only DME Diabetic Shoe ?-     For Home Use Only DME Diabetic Shoe ? ?Hypertension associated with diabetes ([place]) ?Soft BP at last 2 visits. Will decrease HCTZ from 25 to 12.5 mg.  ?-     hydrochlorothiazide (HYDRODIURIL) 12.5 MG tablet; Take 1 tablet (12.5 mg total) by mouth daily. ? ?Foot callus ?-     Cancel: For Home Use Only DME Diabetic Shoe ?-     For Home Use Only DME Diabetic Shoe ? ?Return in about 2 months (around 09/10/2021) for chronic follow up.  ? ?The patient indicates understanding of these issues and agrees with the plan. ? ? ? ?Gwenlyn Perking, FNP ? ?

## 2021-07-12 NOTE — Patient Instructions (Signed)
Diabetes Mellitus and Foot Care Foot care is an important part of your health, especially when you have diabetes. Diabetes may cause you to have problems because of poor blood flow (circulation) to your feet and legs, which can cause your skin to: Become thinner and drier. Break more easily. Heal more slowly. Peel and crack. You may also have nerve damage (neuropathy) in your legs and feet, causing decreased feeling in them. This means that you may not notice minor injuries to your feet that could lead to more serious problems. Noticing and addressing any potential problems early is the best way to prevent future foot problems. How to care for your feet Foot hygiene  Wash your feet daily with warm water and mild soap. Do not use hot water. Then, pat your feet and the areas between your toes until they are completely dry. Do not soak your feet as this can dry your skin. Trim your toenails straight across. Do not dig under them or around the cuticle. File the edges of your nails with an emery board or nail file. Apply a moisturizing lotion or petroleum jelly to the skin on your feet and to dry, brittle toenails. Use lotion that does not contain alcohol and is unscented. Do not apply lotion between your toes. Shoes and socks Wear clean socks or stockings every day. Make sure they are not too tight. Do not wear knee-high stockings since they may decrease blood flow to your legs. Wear shoes that fit properly and have enough cushioning. Always look in your shoes before you put them on to be sure there are no objects inside. To break in new shoes, wear them for just a few hours a day. This prevents injuries on your feet. Wounds, scrapes, corns, and calluses  Check your feet daily for blisters, cuts, bruises, sores, and redness. If you cannot see the bottom of your feet, use a mirror or ask someone for help. Do not cut corns or calluses or try to remove them with medicine. If you find a minor scrape,  cut, or break in the skin on your feet, keep it and the skin around it clean and dry. You may clean these areas with mild soap and water. Do not clean the area with peroxide, alcohol, or iodine. If you have a wound, scrape, corn, or callus on your foot, look at it several times a day to make sure it is healing and not infected. Check for: Redness, swelling, or pain. Fluid or blood. Warmth. Pus or a bad smell. General tips Do not cross your legs. This may decrease blood flow to your feet. Do not use heating pads or hot water bottles on your feet. They may burn your skin. If you have lost feeling in your feet or legs, you may not know this is happening until it is too late. Protect your feet from hot and cold by wearing shoes, such as at the beach or on hot pavement. Schedule a complete foot exam at least once a year (annually) or more often if you have foot problems. Report any cuts, sores, or bruises to your health care provider immediately. Where to find more information American Diabetes Association: www.diabetes.org Association of Diabetes Care & Education Specialists: www.diabeteseducator.org Contact a health care provider if: You have a medical condition that increases your risk of infection and you have any cuts, sores, or bruises on your feet. You have an injury that is not healing. You have redness on your legs or feet. You   feel burning or tingling in your legs or feet. You have pain or cramps in your legs and feet. Your legs or feet are numb. Your feet always feel cold. You have pain around any toenails. Get help right away if: You have a wound, scrape, corn, or callus on your foot and: You have pain, swelling, or redness that gets worse. You have fluid or blood coming from the wound, scrape, corn, or callus. Your wound, scrape, corn, or callus feels warm to the touch. You have pus or a bad smell coming from the wound, scrape, corn, or callus. You have a fever. You have a red  line going up your leg. Summary Check your feet every day for blisters, cuts, bruises, sores, and redness. Apply a moisturizing lotion or petroleum jelly to the skin on your feet and to dry, brittle toenails. Wear shoes that fit properly and have enough cushioning. If you have foot problems, report any cuts, sores, or bruises to your health care provider immediately. Schedule a complete foot exam at least once a year (annually) or more often if you have foot problems. This information is not intended to replace advice given to you by your health care provider. Make sure you discuss any questions you have with your health care provider. Document Revised: 09/16/2019 Document Reviewed: 09/16/2019 Elsevier Patient Education  2023 [employer]  

## 2021-07-16 ENCOUNTER — Encounter: Payer: Self-pay | Admitting: Nurse Practitioner

## 2021-07-16 ENCOUNTER — Telehealth: Payer: Self-pay | Admitting: Family Medicine

## 2021-07-16 ENCOUNTER — Ambulatory Visit (INDEPENDENT_AMBULATORY_CARE_PROVIDER_SITE_OTHER): Payer: Medicare Other | Admitting: Nurse Practitioner

## 2021-07-16 VITALS — BP 145/94 | HR 73 | Temp 98.1°F | Resp 20 | Ht 66.0 in | Wt 194.0 lb

## 2021-07-16 DIAGNOSIS — K219 Gastro-esophageal reflux disease without esophagitis: Secondary | ICD-10-CM

## 2021-07-16 DIAGNOSIS — R079 Chest pain, unspecified: Secondary | ICD-10-CM

## 2021-07-16 DIAGNOSIS — H52223 Regular astigmatism, bilateral: Secondary | ICD-10-CM | POA: Diagnosis not present

## 2021-07-16 DIAGNOSIS — H524 Presbyopia: Secondary | ICD-10-CM | POA: Diagnosis not present

## 2021-07-16 MED ORDER — FAMOTIDINE 20 MG PO TABS: ORAL_TABLET | ORAL | 1 refills | Status: DC

## 2021-07-16 NOTE — Telephone Encounter (Signed)
Patient informed. 

## 2021-07-16 NOTE — Telephone Encounter (Signed)
Yes and follow up with PCP ?

## 2021-07-16 NOTE — Progress Notes (Signed)
? ?Subjective:  ? ? Patient ID: Anthony Swanson, male    DOB: 1950-08-06, 71 y.o.   MRN: [id number] ? ? ?Chief Complaint: Chest Pain (Since starting Rybelsus 7mg  this AM) ? ? ?Chest Pain  ?This is a new problem. The current episode started today (couple of hours ago). The onset quality is sudden. The problem occurs intermittently. The problem has been waxing and waning. The pain is present in the substernal region. The pain is at a severity of 8/10. The pain is severe. The quality of the pain is described as burning. The pain does not radiate. Pertinent negatives include no nausea or shortness of breath. The pain is aggravated by nothing. He has tried nothing for the symptoms. Risk factors include lack of exercise, male gender and obesity.  ?His past medical history is significant for diabetes, hyperlipidemia and hypertension.  ?Patient states that he is on rybelsus for diabetes and his dose was increased from 3mg  to 7mg  daily. He took his first 7mg  dose this morning. Now he is having chest pain.  ? ? ? ?Review of Systems  ?HENT: Negative.    ?Respiratory:  Negative for shortness of breath.   ?Cardiovascular:  Positive for chest pain.  ?Gastrointestinal:  Negative for constipation and nausea.  ?Neurological: Negative.   ?Psychiatric/Behavioral: Negative.    ?All other systems reviewed and are negative. ? ?   ?Objective:  ? Physical Exam ?Vitals and nursing note reviewed.  ?Constitutional:   ?   Appearance: Normal appearance. He is well-developed.  ?HENT:  ?   Head: Normocephalic.  ?   Nose: Nose normal.  ?   Mouth/Throat:  ?   Mouth: Mucous membranes are moist.  ?   Pharynx: Oropharynx is clear.  ?Eyes:  ?   Pupils: Pupils are equal, round, and reactive to light.  ?Neck:  ?   Thyroid: No thyroid mass or thyromegaly.  ?   Vascular: No carotid bruit or JVD.  ?   Trachea: Phonation normal.  ?Cardiovascular:  ?   Rate and Rhythm: Normal rate and regular rhythm.  ?   Comments: pain on palpation of chest wall ?Pulmonary:  ?    Effort: Pulmonary effort is normal. No respiratory distress.  ?   Breath sounds: Normal breath sounds.  ?Abdominal:  ?   General: Bowel sounds are normal.  ?   Palpations: Abdomen is soft.  ?   Tenderness: There is no abdominal tenderness.  ?Musculoskeletal:     ?   General: Normal range of motion.  ?   Cervical back: Normal range of motion and neck supple.  ?Lymphadenopathy:  ?   Cervical: No cervical adenopathy.  ?Skin: ?   General: Skin is warm and dry.  ?Neurological:  ?   Mental Status: He is alert and oriented to person, place, and time.  ?Psychiatric:     ?   Behavior: Behavior normal.     ?   Thought Content: Thought content normal.     ?   Judgment: Judgment normal.  ? ? ?BP (!) 145/94   Pulse 73   Temp 98.1 ?F (36.7 ?C) (Temporal)   Resp 20   Ht 5\' 6"  (1.676 m)   Wt 194 lb (88 kg)   SpO2 95%   BMI 31.31 kg/m?  ? ?EKG-NSR ?   ?Assessment & Plan:  ? ? in today with chief complaint of Chest Pain (Since starting Rybelsus 7mg  this AM) ? ? ?1. Chest pain, unspecified type ?-  EKG 12-Lead ?If chest pain continues need to go to the ED ? ?2. Gastroesophageal reflux disease without esophagitis ?First 24 Hours-Clear liquids ? popsicles ? Jello ? gatorade ? Sprite ?Second 24 hours-Add Full liquids ( Liquids you cant see through) ?Third 24 hours- Bland diet ( foods that are baked or broiled) ? *avoiding fried foods and highly spiced foods* ?During these 3 days ? Avoid milk, cheese, ice cream or any other dairy products ? Avoid caffeine- REMEMBER Mt. Dew and Mello Yellow contain lots of caffeine ?You should eat and drink in  Frequent small volumes ?If no improvement in symptoms or worsen in 2-3 days should RETRUN TO OFFICE or go to ER! ?  ? ?- famotidine (PEPCID) 20 MG tablet; TAKE 1 TABLET BY MOUTH EVERY DAY AFTER dinner  Dispense: 90 tablet; Refill: 1 ? ? ? ?The above assessment and management plan was discussed with the patient. The patient verbalized understanding of and has agreed to the  management plan. Patient is aware to call the clinic if symptoms persist or worsen. Patient is aware when to return to the clinic for a follow-up visit. Patient educated on when it is appropriate to go to the emergency department.  ? ?Mary-Margaret Daphine Deutscher, FNP ? ? ?

## 2021-07-16 NOTE — Patient Instructions (Signed)

## 2021-07-20 ENCOUNTER — Telehealth: Payer: Self-pay | Admitting: Pharmacist

## 2021-07-20 ENCOUNTER — Ambulatory Visit (INDEPENDENT_AMBULATORY_CARE_PROVIDER_SITE_OTHER): Payer: Medicare Other | Admitting: Pharmacist

## 2021-07-20 DIAGNOSIS — E1169 Type 2 diabetes mellitus with other specified complication: Secondary | ICD-10-CM

## 2021-07-20 DIAGNOSIS — E1140 Type 2 diabetes mellitus with diabetic neuropathy, unspecified: Secondary | ICD-10-CM

## 2021-07-20 NOTE — Progress Notes (Signed)
Chronic Care Management Pharmacy Note  07/20/2021 Name:  Anthony Swanson MRN:  [id number] DOB:  [birth year]  Summary:  Diabetes: New goal. 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, continue Continue rybelsus 61m & OTHER MEDS AS ABOVE Don't cut in half, don't take with coffee Take with water Denies personal and family history of Medullary thyroid cancer (MTC) Patient states they are affordable Denies hypoglycemic/hyperglycemic symptoms Discussed meal planning options and Plate method for healthy eating Avoid sugary drinks and desserts Incorporate balanced protein, non starchy veggies, 1 serving of carbohydrate with each meal Increase water intake Increase physical activity as able Current exercise: ENCOURAGED Recommended continue current regimen  Assessed patient finances. Patient to bring financials if rybelsus/jardiance is not affrodabale  Subjective: Anthony COATSis an 71y.o. year old male who is a primary patient of MGwenlyn Perking FNP.  The CCM team was consulted for assistance with disease management and care coordination needs.    Engaged with patient by telephone for initial visit in response to provider referral for pharmacy case management and/or care coordination services.   Consent to Services:  The patient was given information about Chronic Care Management services, agreed to services, and gave verbal consent prior to initiation of services.  Please see initial visit note for detailed documentation.   Patient Care Team: MGwenlyn Perking FNP as PCP - General (Family Medicine) JCelestia Khat OMahomet(Optometry) HIlean China RN as Case Manager PLavera Guise [facility] (Family Medicine)  Objective:  Lab Results  Component Value Date   CREATININE 1.26 06/11/2021   CREATININE 1.12 03/13/2021   CREATININE 1.28 (H) 03/08/2021    Lab Results  Component Value Date   HGBA1C  9.1 (H) 06/11/2021   Last diabetic Eye exam:  Lab Results  Component Value Date/Time   HMDIABEYEEXA No Retinopathy 07/13/2020 12:00 AM    Last diabetic Foot exam: No results found for: HMDIABFOOTEX      Component Value Date/Time   CHOL 142 06/11/2021 1131   TRIG 193 (H) 06/11/2021 1131   HDL 51 06/11/2021 1131   CHOLHDL 2.8 06/11/2021 1131   LDLCALC 59 06/11/2021 1131       Latest Ref Rng & Units 06/11/2021   11:31 AM 12/07/2020    1:05 PM 08/17/2020   10:43 AM  Hepatic Function  Total Protein 6.0 - 8.5 g/dL 7.0   7.0   6.5    Albumin 3.7 - 4.7 g/dL 4.6   4.9   4.1    AST 0 - 40 IU/L _0 ALT 0 - 44 IU/L _1 Alk Phosphatase 44 - 121 IU/L 66   63   53    Total Bilirubin 0.0 - 1.2 mg/dL 0.3   0.4   0.2      No results found for: TSH, FREET4     Latest Ref Rng & Units 06/11/2021   11:31 AM 12/07/2020    1:05 PM 08/17/2020   10:43 AM  CBC  WBC 3.4 - 10.8 x10E3/uL 8.4   10.6   7.1    Hemoglobin 13.0 - 17.7 g/dL 15.9   16.3   15.1    Hematocrit 37.5 - 51.0 % 47.6   47.6   46.1    Platelets 150 - 450 x10E3/uL 247   268   220  No results found for: VD25OH  Clinical ASCVD: Yes  The ASCVD Risk score (Arnett DK, et al., 2019) failed to calculate for the following reasons:   The patient has a prior MI or stroke diagnosis    Other: (CHADS2VASc if Afib, PHQ9 if depression, MMRC or CAT for COPD, ACT, DEXA)  Social History   Tobacco Use  Smoking Status Former   Packs/day: 1.00   Years: 6.00   Pack years: 6.00   Types: Cigarettes   Quit date: 80   Years since quitting: 46.4  Smokeless Tobacco Current   Types: Chew   BP Readings from Last 3 Encounters:  07/25/21 114/65  07/16/21 (!) 145/94  07/12/21 96/65   Pulse Readings from Last 3 Encounters:  07/25/21 82  07/16/21 73  07/12/21 95   Wt Readings from Last 3 Encounters:  07/25/21 192 lb 2 oz (87.1 kg)  07/16/21 194 lb (88 kg)  07/12/21 195 lb (88.5 kg)    Assessment: Review of  patient past medical history, allergies, medications, health status, including review of consultants reports, laboratory and other test data, was performed as part of comprehensive evaluation and provision of chronic care management services.   SDOH:  (Social Determinants of Health) assessments and interventions performed:    CCM Care Plan  Allergies  Allergen Reactions   Codeine    Penicillins    Percocet [Oxycodone-Acetaminophen]    Sulfa Antibiotics     Medications Reviewed Today     Reviewed by Lavera Guise, [facility] (Pharmacist) on 07/27/21 at 1206  Med List Status: <None>   Medication Order Taking? Sig Documenting Provider Last Dose Status Informant  ACCU-CHEK GUIDE test strip [id number] No Test BS daily Dx E11.9 Gwenlyn Perking, FNP Taking Active   Accu-Chek Softclix Lancets lancets [id number] No USE TO TEST BLOOD SUGAR DAILY Dx E11.40 Gwenlyn Perking, FNP Taking Active   acetaminophen (TYLENOL) 500 MG tablet [id number] No Take 500 mg by mouth every 6 (six) hours as needed. For pain [provider] Taking Active Self  albuterol (PROVENTIL) (2.5 MG/3ML) 0.083% nebulizer solution [id number] No SMARTSIG:1 Vial(s) Via Nebulizer 4 Times Daily PRN Sharion Balloon, FNP Taking Active   albuterol (VENTOLIN HFA) 108 (90 Base) MCG/ACT inhaler [id number] No INHALE TWO PUFFS BY MOUTH EVERY 6 HOURS AS NEEDED FOR SHORTNESS OF BREATH Gwenlyn Perking, FNP Taking Active   blood glucose meter kit and supplies [id number] No Dispense based on patient and insurance preference. Use up to four times daily as directed. (FOR ICD-10 E10.9, E11.9). Gwenlyn Perking, FNP Taking Active   Blood Glucose Monitoring Suppl (ACCU-CHEK GUIDE) w/Device KIT [id number] No Test BS daily Dx E11.9 Gwenlyn Perking, FNP Taking Active   empagliflozin (JARDIANCE) 25 MG TABS tablet [id number] No Take 1 tablet (25 mg total) by mouth daily before breakfast. Gwenlyn Perking, FNP Taking Active   famotidine (PEPCID) 20 MG tablet  [id number] No TAKE 1 TABLET BY MOUTH EVERY DAY AFTER dinner Hassell Done, Mary-Margaret, FNP Taking Active   gabapentin (NEURONTIN) 600 MG tablet [id number] No Take 1 tablet (600 mg total) by mouth 2 (two) times daily. Gwenlyn Perking, FNP Taking Active   glipiZIDE (GLUCOTROL) 10 MG tablet [id number] No TAKE 1 TABLET BY MOUTH TWICE DAILY Gwenlyn Perking, FNP Taking Active   hydrochlorothiazide (HYDRODIURIL) 12.5 MG tablet [id number] No Take 1 tablet (12.5 mg total) by mouth daily. Gwenlyn Perking, FNP Taking Active   lidocaine (XYLOCAINE) 5 % ointment [id number] No Apply 1 application  topically as needed. Use for back pain [provider] Taking Active   losartan (COZAAR) 50 MG tablet [id number] No TAKE 1 TABLET BY MOUTH EVERY DAY Gwenlyn Perking, FNP Taking Active   omeprazole (PRILOSEC) 40 MG capsule [id number] No TAKE ONE CAPSULE BY MOUTH EVERY MORNING Gwenlyn Perking, FNP Taking Active   Semaglutide (RYBELSUS) 7 MG TABS [id number] No Take 7 mg by mouth daily. Gwenlyn Perking, FNP Taking Active   simvastatin (ZOCOR) 20 MG tablet [id number] No TAKE 1 TABLET BY MOUTH EVERY MORNING Gwenlyn Perking, FNP Taking Active   sodium chloride irrigation 0.9 % irrigation [id number] No SMARTSIG:Both Nares [provider] Taking Active   traZODone (DESYREL) 50 MG tablet [id number] No Take 25-50 mg by mouth at bedtime as needed. [provider] Taking Active   vitamin C (ASCORBIC ACID) 500 MG tablet [id number] No Take 500 mg by mouth daily. [provider] Taking Active             Patient Active Problem List   Diagnosis Date Noted   Chronic midline low back pain without sciatica 10/23/2020   Finger laceration 07/20/2020   Type 2 diabetes mellitus with diabetic neuropathy, without long-term current use of insulin ([place]) 05/17/2020   Asthma 05/17/2020   Hyperlipidemia associated with type 2 diabetes mellitus ([place]) 05/17/2020   Hypertension associated with diabetes ([place])  05/17/2020   History of myocardial infarction 05/17/2020   Arthritis 05/17/2020   Allergies 05/17/2020   Gastroesophageal reflux disease without esophagitis 05/17/2020   Insomnia 05/17/2020    Immunization History  Administered Date(s) Administered   Fluad Quad(high Dose 65+) 05/17/2020   Moderna Sars-Covid-2 Vaccination 07/13/2019, 08/17/2019   Pneumococcal Conjugate-13 06/05/2016   Tdap 01/18/1999, 01/02/2005, 08/14/2016, 03/10/2021   Zoster Recombinat (Shingrix) 06/05/2016, 08/14/2016    Conditions to be addressed/monitored: CAD, DMII, and CKD Stage 2  Care Plan : PHARMD MEDICATION MANAGEMENT  Updates made by Lavera Guise, [place] since 07/27/2021 12:00 AM     Problem: DISEASE PROGRESSION PREVENTION      Long-Range Goal: T2DM PHARMD GOAL   This Visit's Progress: Not on track  Priority: High  Note:   Current Barriers:  Unable to independently afford treatment regimen Unable to maintain control of T2DM Suboptimal therapeutic regimen for T2DM  Pharmacist Clinical Goal(s):  patient will verbalize ability to afford treatment regimen maintain control of T2DM as evidenced by GOAL A1C<7%  through collaboration with PharmD and provider.    Interventions: 1:1 collaboration with Gwenlyn Perking, FNP regarding development and update of comprehensive plan of care as evidenced by provider attestation and co-signature Inter-disciplinary care team collaboration (see longitudinal plan of care) Comprehensive medication review performed; medication list updated in electronic medical record  Diabetes: New goal. 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, continue Continue rybelsus 77m & OTHER MEDS AS ABOVE Don't cut in half, don't take with coffee Take with water Denies personal and family history of Medullary thyroid cancer (MTC) Patient states they are affordable Denies hypoglycemic/hyperglycemic  symptoms Discussed meal planning options and Plate method for healthy eating Avoid sugary drinks and desserts Incorporate balanced protein, non starchy veggies, 1 serving of carbohydrate with each meal Increase water intake Increase physical activity as able Current exercise: ENCOURAGED Recommended continue current regimen  Assessed patient finances. Patient to bring financials if rybelsus/jardiance is not affrodabale   Patient Goals/Self-Care Activities patient will:  - take medications as prescribed as evidenced by patient  report and record review check glucose daily fasting or if symptomatic, document, and provide at future appointments collaborate with provider on medication access solutions target a minimum of 150 minutes of moderate intensity exercise weekly engage in dietary modifications by   FOLLOWING A HEART HEALTHY DIET/HEALTHY PLATE METHOD       Medication Assistance: None required.  Patient affirms current coverage meets needs.  Patient's preferred pharmacy is:  [facility] [place] [address] [place] [address] Phone: [phone] Fax: [phone]   Follow Up:  Patient agrees to Care Plan and Follow-up.  Plan: Telephone follow up appointment with care management team member scheduled for:  1 MONTH   Regina Eck, PharmD, BCPS Clinical Pharmacist, [place]  [facility] Phone [phone]

## 2021-07-20 NOTE — Telephone Encounter (Signed)
Patient requesting further studies/imaging done on his heart ?He had EKG done this month ?Chest pain has resolved/likely GERD related to increased rybelsus dose ? ?We talked through adverse effects of rybelsus (patient was cutting in half--NOT supposed to be cutting in half)--he was also taking rybelsus every morning with hot coffee ?Advised patient to take with 4 oz water ?Continue meds for heart burn ?Will reach out in 2 weeks to make sure resolved ?He denied ozempic RX ?Consider Alma Friendly but will not likely get him to goal A1c ?Continue meds ? ?Please advise on cardiac.  Patient is concerned for blockages etc and wants to be proactive ? ?Next f/u with PCP in July ? ?Thank you! ?

## 2021-07-23 NOTE — Telephone Encounter (Signed)
Pt called and appt made for [facility] for cardiac work up and possible referral  ?

## 2021-07-23 NOTE — Telephone Encounter (Signed)
He needs to have an appt for evaluation if symptoms have not resolved.  ?

## 2021-07-25 ENCOUNTER — Encounter: Payer: Self-pay | Admitting: Family Medicine

## 2021-07-25 ENCOUNTER — Ambulatory Visit (INDEPENDENT_AMBULATORY_CARE_PROVIDER_SITE_OTHER): Payer: Medicare Other | Admitting: Family Medicine

## 2021-07-25 VITALS — BP 114/65 | HR 82 | Temp 98.0°F | Ht 66.0 in | Wt 192.1 lb

## 2021-07-25 DIAGNOSIS — E1159 Type 2 diabetes mellitus with other circulatory complications: Secondary | ICD-10-CM | POA: Diagnosis not present

## 2021-07-25 DIAGNOSIS — I152 Hypertension secondary to endocrine disorders: Secondary | ICD-10-CM | POA: Diagnosis not present

## 2021-07-25 DIAGNOSIS — R079 Chest pain, unspecified: Secondary | ICD-10-CM | POA: Diagnosis not present

## 2021-07-25 DIAGNOSIS — K219 Gastro-esophageal reflux disease without esophagitis: Secondary | ICD-10-CM | POA: Diagnosis not present

## 2021-07-25 NOTE — Progress Notes (Signed)
? ?Established Patient Office Visit ? ?Subjective   ?Patient ID: Anthony Swanson, male    DOB: May 28, 1950  Age: 71 y.o. MRN: [id number] ? ?Chief Complaint  ?Patient presents with  ? Medical Management of Chronic Issues  ? ? ?HPI ?Anthony Swanson is here for a follow up of chest pain. He had an episode of chest pain on 07/16/21. He reports burning substernal chest pain. He did not have any other symptom. He had taken his first 7mg  dosage of rybelsus. He he a normal EKG that day in the office. He was started on pepcid for GERD. He reports that he has not had any chest pain since. Denies nausea, vomiting, diaphoresis, chest pain, shortness of breath, visual disturbances, focal weakness, dizziness, or edema.  ? ?Patient Active Problem List  ? Diagnosis Date Noted  ? Chronic midline low back pain without sciatica 10/23/2020  ? Finger laceration 07/20/2020  ? Type 2 diabetes mellitus with diabetic neuropathy, without long-term current use of insulin ([facility]) 05/17/2020  ? Asthma 05/17/2020  ? Hyperlipidemia associated with type 2 diabetes mellitus (HCC) 05/17/2020  ? Hypertension associated with diabetes (HCC) 05/17/2020  ? History of myocardial infarction 05/17/2020  ? Arthritis 05/17/2020  ? Allergies 05/17/2020  ? Gastroesophageal reflux disease without esophagitis 05/17/2020  ? Insomnia 05/17/2020  ? ?  ? ?ROS ?As per HPI.  ? ?  ?Objective:  ?  ? ?BP 114/65   Pulse 82   Temp 98 ?F (36.7 ?C) (Temporal)   Ht 5\' 6"  (1.676 m)   Wt 192 lb 2 oz (87.1 kg)   SpO2 93%   BMI 31.01 kg/m?  ?BP Readings from Last 3 Encounters:  ?07/25/21 114/65  ?07/16/21 (!) 145/94  ?07/12/21 96/65  ? ?  ? ?Physical Exam ?Vitals and nursing note reviewed.  ?Constitutional:   ?   General: He is not in acute distress. ?   Appearance: He is not ill-appearing, toxic-appearing or diaphoretic.  ?Neck:  ?   Vascular: No carotid bruit or JVD.  ?Cardiovascular:  ?   Rate and Rhythm: Normal rate and regular rhythm.  ?   Heart sounds: Normal heart sounds. No murmur  heard. ?Pulmonary:  ?   Effort: Pulmonary effort is normal. No respiratory distress.  ?   Breath sounds: Normal breath sounds.  ?Abdominal:  ?   General: Bowel sounds are normal. There is no distension.  ?   Tenderness: There is no abdominal tenderness. There is no guarding or rebound.  ?Musculoskeletal:  ?   Right lower leg: No edema.  ?   Left lower leg: No edema.  ?Skin: ?   General: Skin is warm and dry.  ?Neurological:  ?   Mental Status: He is alert and oriented to person, place, and time. Mental status is at baseline.  ?Psychiatric:     ?   Mood and Affect: Mood normal.     ?   Behavior: Behavior normal.  ? ? ? ?No results found for any visits on 07/25/21. ? ? ? ?The ASCVD Risk score (Arnett DK, et al., 2019) failed to calculate for the following reasons: ?  The patient has a prior MI or stroke diagnosis ? ?  ?Assessment & Plan:  ? ?Macy was seen today for medical management of chronic issues. ? ?Diagnoses and all orders for this visit: ? ?Chest pain, unspecified type ?Resolved with addition of pepcid.  ? ?Gastroesophageal reflux disease without esophagitis ?Well controlled on current regimen. Continue prilosec and pepcid.  ? ?Hypertension  associated with diabetes (HCC) ?Continue current regimen. BP well controlled today.  ? ? ?Return if symptoms worsen or fail to improve. Keep follow up appointment.  ? ?The patient indicates understanding of these issues and agrees with the plan. ? ?Gabriel Earing, FNP ? ?

## 2021-07-25 NOTE — Patient Instructions (Signed)
Hypertension, Adult High blood pressure (hypertension) is when the force of blood pumping through the arteries is too strong. The arteries are the blood vessels that carry blood from the heart throughout the body. Hypertension forces the heart to work harder to pump blood and may cause arteries to become narrow or stiff. Untreated or uncontrolled hypertension can lead to a heart attack, heart failure, a stroke, kidney disease, and other problems. A blood pressure reading consists of a higher number over a lower number. Ideally, your blood pressure should be below 120/80. The first ("top") number is called the systolic pressure. It is a measure of the pressure in your arteries as your heart beats. The second ("bottom") number is called the diastolic pressure. It is a measure of the pressure in your arteries as the heart relaxes. What are the causes? The exact cause of this condition is not known. There are some conditions that result in high blood pressure. What increases the risk? Certain factors may make you more likely to develop high blood pressure. Some of these risk factors are under your control, including: Smoking. Not getting enough exercise or physical activity. Being overweight. Having too much fat, sugar, calories, or salt (sodium) in your diet. Drinking too much alcohol. Other risk factors include: Having a personal history of heart disease, diabetes, high cholesterol, or kidney disease. Stress. Having a family history of high blood pressure and high cholesterol. Having obstructive sleep apnea. Age. The risk increases with age. What are the signs or symptoms? High blood pressure may not cause symptoms. Very high blood pressure (hypertensive crisis) may cause: Headache. Fast or irregular heartbeats (palpitations). Shortness of breath. Nosebleed. Nausea and vomiting. Vision changes. Severe chest pain, dizziness, and seizures. How is this diagnosed? This condition is diagnosed by  measuring your blood pressure while you are seated, with your arm resting on a flat surface, your legs uncrossed, and your feet flat on the floor. The cuff of the blood pressure monitor will be placed directly against the skin of your upper arm at the level of your heart. Blood pressure should be measured at least twice using the same arm. Certain conditions can cause a difference in blood pressure between your right and left arms. If you have a high blood pressure reading during one visit or you have normal blood pressure with other risk factors, you may be asked to: Return on a different day to have your blood pressure checked again. Monitor your blood pressure at home for 1 week or longer. If you are diagnosed with hypertension, you may have other blood or imaging tests to help your health care provider understand your overall risk for other conditions. How is this treated? This condition is treated by making healthy lifestyle changes, such as eating healthy foods, exercising more, and reducing your alcohol intake. You may be referred for counseling on a healthy diet and physical activity. Your health care provider may prescribe medicine if lifestyle changes are not enough to get your blood pressure under control and if: Your systolic blood pressure is above 130. Your diastolic blood pressure is above 80. Your personal target blood pressure may vary depending on your medical conditions, your age, and other factors. Follow these instructions at home: Eating and drinking  Eat a diet that is high in fiber and potassium, and low in sodium, added sugar, and fat. An example of this eating plan is called the DASH diet. DASH stands for Dietary Approaches to Stop Hypertension. To eat this way: Eat   plenty of fresh fruits and vegetables. Try to fill one half of your plate at each meal with fruits and vegetables. Eat whole grains, such as whole-wheat pasta, brown rice, or whole-grain bread. Fill about one  fourth of your plate with whole grains. Eat or drink low-fat dairy products, such as skim milk or low-fat yogurt. Avoid fatty cuts of meat, processed or cured meats, and poultry with skin. Fill about one fourth of your plate with lean proteins, such as fish, chicken without skin, beans, eggs, or tofu. Avoid pre-made and processed foods. These tend to be higher in sodium, added sugar, and fat. Reduce your daily sodium intake. Many people with hypertension should eat less than 1,500 mg of sodium a day. Do not drink alcohol if: Your health care provider tells you not to drink. You are pregnant, may be pregnant, or are planning to become pregnant. If you drink alcohol: Limit how much you have to: 0-1 drink a day for women. 0-2 drinks a day for men. Know how much alcohol is in your drink. In the [place] one drink equals one 12 oz bottle of beer (355 mL), one 5 oz glass of wine (148 mL), or one 1 oz glass of hard liquor (44 mL). Lifestyle  Work with your health care provider to maintain a healthy body weight or to lose weight. Ask what an ideal weight is for you. Get at least 30 minutes of exercise that causes your heart to beat faster (aerobic exercise) most days of the week. Activities may include walking, swimming, or biking. Include exercise to strengthen your muscles (resistance exercise), such as Pilates or lifting weights, as part of your weekly exercise routine. Try to do these types of exercises for 30 minutes at least 3 days a week. Do not use any products that contain nicotine or tobacco. These products include cigarettes, chewing tobacco, and vaping devices, such as e-cigarettes. If you need help quitting, ask your health care provider. Monitor your blood pressure at home as told by your health care provider. Keep all follow-up visits. This is important. Medicines Take over-the-counter and prescription medicines only as told by your health care provider. Follow directions carefully. Blood  pressure medicines must be taken as prescribed. Do not skip doses of blood pressure medicine. Doing this puts you at risk for problems and can make the medicine less effective. Ask your health care provider about side effects or reactions to medicines that you should watch for. Contact a health care provider if you: Think you are having a reaction to a medicine you are taking. Have headaches that keep coming back (recurring). Feel dizzy. Have swelling in your ankles. Have trouble with your vision. Get help right away if you: Develop a severe headache or confusion. Have unusual weakness or numbness. Feel faint. Have severe pain in your chest or abdomen. Vomit repeatedly. Have trouble breathing. These symptoms may be an emergency. Get help right away. Call 911. Do not wait to see if the symptoms will go away. Do not drive yourself to the hospital. Summary Hypertension is when the force of blood pumping through your arteries is too strong. If this condition is not controlled, it may put you at risk for serious complications. Your personal target blood pressure may vary depending on your medical conditions, your age, and other factors. For most people, a normal blood pressure is less than 120/80. Hypertension is treated with lifestyle changes, medicines, or a combination of both. Lifestyle changes include losing weight, eating a healthy,   low-sodium diet, exercising more, and limiting alcohol. This information is not intended to replace advice given to you by your health care provider. Make sure you discuss any questions you have with your health care provider. Document Revised: 01/02/2021 Document Reviewed: 01/02/2021 Elsevier Patient Education  2023 [employer]  

## 2021-07-27 NOTE — Patient Instructions (Addendum)
Visit Information  Following are the goals we discussed today:  Diabetes: New goal. 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, continue Continue rybelsus 7mg  & OTHER MEDS AS ABOVE Don't cut in half, don't take with coffee Take with water Denies personal and family history of Medullary thyroid cancer (MTC) Patient states they are affordable Denies hypoglycemic/hyperglycemic symptoms Discussed meal planning options and Plate method for healthy eating Avoid sugary drinks and desserts Incorporate balanced protein, non starchy veggies, 1 serving of carbohydrate with each meal Increase water intake Increase physical activity as able Current exercise: ENCOURAGED Recommended continue current regimen  Assessed patient finances. Patient to bring financials if rybelsus/jardiance is not affrodabale Plan: Telephone follow up appointment with care management team member scheduled for:  1 month  Signature Regina Eck, PharmD, BCPS Clinical Pharmacist, [place]  II Phone [phone]   Please call the care guide team at [phone] if you need to cancel or reschedule your appointment.   The patient verbalized understanding of instructions, educational materials, and care plan provided today and DECLINED offer to receive copy of patient instructions, educational materials, and care plan.

## 2021-07-31 ENCOUNTER — Telehealth: Payer: Self-pay | Admitting: Family Medicine

## 2021-07-31 NOTE — Telephone Encounter (Signed)
Form received & placed on provider's desk for signature

## 2021-08-03 NOTE — Telephone Encounter (Signed)
Wanting update on form. Do you still have it? I have not seen it in cathys office.

## 2021-08-03 NOTE — Telephone Encounter (Signed)
I do not have the form. Does cathy?

## 2021-08-03 NOTE — Telephone Encounter (Signed)
Pt is calling back to check on form for diabetic shoes. He said [facility] faxed in on the 23rd and have not heard anything. Their fax is [phone] attn Tam

## 2021-08-08 DIAGNOSIS — E1140 Type 2 diabetes mellitus with diabetic neuropathy, unspecified: Secondary | ICD-10-CM

## 2021-08-08 DIAGNOSIS — E785 Hyperlipidemia, unspecified: Secondary | ICD-10-CM

## 2021-08-08 DIAGNOSIS — Z7984 Long term (current) use of oral hypoglycemic drugs: Secondary | ICD-10-CM

## 2021-08-08 DIAGNOSIS — E1169 Type 2 diabetes mellitus with other specified complication: Secondary | ICD-10-CM

## 2021-08-08 NOTE — Telephone Encounter (Signed)
Pt aware.

## 2021-08-08 NOTE — Telephone Encounter (Signed)
LMOVM form faxed to [facility]

## 2021-08-16 ENCOUNTER — Ambulatory Visit: Payer: Medicare Other | Admitting: Pharmacist

## 2021-09-06 ENCOUNTER — Telehealth: Payer: Self-pay | Admitting: Pharmacist

## 2021-09-06 ENCOUNTER — Ambulatory Visit: Payer: Medicare Other | Admitting: Pharmacist

## 2021-09-06 NOTE — Telephone Encounter (Signed)
  Care Management   Follow Up Note   09/06/2021 Name: Anthony Swanson MRN: [id number] DOB: 09-30-1950   Referred by: Gabriel Earing, FNP Reason for referral : Medication Management--RYBELSUS   An unsuccessful telephone outreach was attempted today. The patient was referred to the case management team for assistance with care management and care coordination.   Follow Up Plan: A HIPPA compliant phone message was left for the patient providing contact information and requesting a return call.    Kieth Brightly, PharmD, BCPS Clinical Pharmacist, [facility]  II Phone [phone]

## 2021-09-12 ENCOUNTER — Ambulatory Visit (INDEPENDENT_AMBULATORY_CARE_PROVIDER_SITE_OTHER): Payer: Medicare Other | Admitting: Family Medicine

## 2021-09-12 ENCOUNTER — Encounter: Payer: Self-pay | Admitting: Family Medicine

## 2021-09-12 ENCOUNTER — Other Ambulatory Visit: Payer: Self-pay | Admitting: Family Medicine

## 2021-09-12 VITALS — BP 108/67 | HR 81 | Temp 97.1°F | Ht 66.0 in | Wt 188.2 lb

## 2021-09-12 DIAGNOSIS — K219 Gastro-esophageal reflux disease without esophagitis: Secondary | ICD-10-CM | POA: Diagnosis not present

## 2021-09-12 DIAGNOSIS — I152 Hypertension secondary to endocrine disorders: Secondary | ICD-10-CM

## 2021-09-12 DIAGNOSIS — E1159 Type 2 diabetes mellitus with other circulatory complications: Secondary | ICD-10-CM | POA: Diagnosis not present

## 2021-09-12 DIAGNOSIS — E1140 Type 2 diabetes mellitus with diabetic neuropathy, unspecified: Secondary | ICD-10-CM

## 2021-09-12 DIAGNOSIS — E785 Hyperlipidemia, unspecified: Secondary | ICD-10-CM

## 2021-09-12 DIAGNOSIS — E6609 Other obesity due to excess calories: Secondary | ICD-10-CM | POA: Diagnosis not present

## 2021-09-12 DIAGNOSIS — Z6830 Body mass index (BMI) 30.0-30.9, adult: Secondary | ICD-10-CM

## 2021-09-12 DIAGNOSIS — E1169 Type 2 diabetes mellitus with other specified complication: Secondary | ICD-10-CM | POA: Diagnosis not present

## 2021-09-12 DIAGNOSIS — E119 Type 2 diabetes mellitus without complications: Secondary | ICD-10-CM

## 2021-09-12 LAB — BAYER DCA HB A1C WAIVED: HB A1C (BAYER DCA - WAIVED): 7.1 % — ABNORMAL HIGH (ref 4.8–5.6)

## 2021-09-12 NOTE — Progress Notes (Signed)
Established Patient Office Visit  Subjective   Patient ID: Anthony Swanson, male    DOB: 06/15/1950  Age: 71 y.o. MRN: [id number]  Chief Complaint  Patient presents with   Medical Management of Chronic Issues   Diabetes   Hyperlipidemia   Hypertension    Diabetes He presents for his follow-up diabetic visit. He has type 2 diabetes mellitus. His disease course has been stable. There are no hypoglycemic associated symptoms. Pertinent negatives for hypoglycemia include no dizziness, headaches, nervousness/anxiousness or sweats. Associated symptoms include foot paresthesias (chronic neuropathy). Pertinent negatives for diabetes include no blurred vision, no chest pain, no fatigue, no foot ulcerations, no polydipsia, no polyphagia, no polyuria, no visual change, no weakness and no weight loss. There are no hypoglycemic complications. Symptoms are stable. Pertinent negatives for diabetic complications include no retinopathy. Risk factors for coronary artery disease include hypertension, male sex, dyslipidemia, obesity and tobacco exposure. Current diabetic treatment includes diet and oral agent (triple therapy). He is compliant with treatment all of the time. His weight is decreasing steadily. He is following a diabetic diet. He participates in exercise daily. An ACE inhibitor/angiotensin II receptor blocker is being taken. Eye exam is current.  Hyperlipidemia This is a chronic problem. Pertinent negatives include no chest pain, focal sensory loss, focal weakness, myalgias or shortness of breath. Current antihyperlipidemic treatment includes statins.  Hypertension This is a chronic problem. The problem is controlled. Pertinent negatives include no anxiety, blurred vision, chest pain, headaches, orthopnea, palpitations, peripheral edema, PND, shortness of breath or sweats. Risk factors for coronary artery disease include diabetes mellitus, male gender, obesity, dyslipidemia and smoking/tobacco exposure.  Past treatments include angiotensin blockers and diuretics. There are no compliance problems.  There is no history of angina or retinopathy.    Patient Active Problem List   Diagnosis Date Noted   Class 1 obesity due to excess calories with serious comorbidity and body mass index (BMI) of 30.0 to 30.9 in adult 09/12/2021   Chronic midline low back pain without sciatica 10/23/2020   Finger laceration 07/20/2020   Type 2 diabetes mellitus with diabetic neuropathy, without long-term current use of insulin ([place]) 05/17/2020   Asthma 05/17/2020   Hyperlipidemia associated with type 2 diabetes mellitus ([place]) 05/17/2020   Hypertension associated with diabetes ([place]) 05/17/2020   History of myocardial infarction 05/17/2020   Arthritis 05/17/2020   Allergies 05/17/2020   Gastroesophageal reflux disease without esophagitis 05/17/2020   Insomnia 05/17/2020      Review of Systems  Constitutional:  Negative for fatigue and weight loss.  Eyes:  Negative for blurred vision.  Respiratory:  Negative for shortness of breath and wheezing.   Cardiovascular:  Negative for chest pain, palpitations, orthopnea and PND.  Musculoskeletal:  Negative for myalgias.  Neurological:  Negative for dizziness, focal weakness, loss of consciousness, weakness and headaches.  Endo/Heme/Allergies:  Negative for polydipsia and polyphagia.  Psychiatric/Behavioral:  Negative for depression. The patient is not nervous/anxious.       Objective:     BP 108/67   Pulse 81   Temp (!) 97.1 F (36.2 C) (Oral)   Ht 5' 6"  (1.676 m)   Wt 188 lb 4 oz (85.4 kg)   SpO2 96%   BMI 30.38 kg/m  BP Readings from Last 3 Encounters:  09/12/21 108/67  07/25/21 114/65  07/16/21 (!) 145/94      Physical Exam Vitals and nursing note reviewed.  Constitutional:      General: He is not in acute distress.  Appearance: He is not ill-appearing, toxic-appearing or diaphoretic.  HENT:     Head: Normocephalic and atraumatic.      Right Ear: Tympanic membrane, ear canal and external ear normal.     Left Ear: Tympanic membrane, ear canal and external ear normal.     Nose: Nose normal.     Mouth/Throat:     Mouth: Mucous membranes are moist.     Pharynx: Oropharynx is clear.  Cardiovascular:     Rate and Rhythm: Normal rate and regular rhythm.     Heart sounds: Normal heart sounds. No murmur heard. Pulmonary:     Effort: Pulmonary effort is normal. No respiratory distress.     Breath sounds: Normal breath sounds. No wheezing or rales.  Abdominal:     General: Bowel sounds are normal. There is no distension.     Palpations: Abdomen is soft.     Tenderness: There is no abdominal tenderness. There is no guarding or rebound.  Musculoskeletal:     Right lower leg: No edema.     Left lower leg: No edema.  Skin:    General: Skin is warm.  Neurological:     General: No focal deficit present.     Mental Status: He is alert and oriented to person, place, and time.  Psychiatric:        Mood and Affect: Mood normal.        Behavior: Behavior normal.      No results found for any visits on 09/12/21.    The ASCVD Risk score (Arnett DK, et al., 2019) failed to calculate for the following reasons:   The patient has a prior MI or stroke diagnosis    Assessment & Plan:   Anthony Swanson was seen today for medical management of chronic issues, diabetes, hyperlipidemia and hypertension.  Diagnoses and all orders for this visit:  Type 2 diabetes mellitus with diabetic neuropathy, without long-term current use of insulin (HCC) A1c is 7.1 today, at goal of <8. Continue jardiance, glipizide, and rybelsus. He declined increase in rybelsus today. Continue diet and exercise. He is on an ARB and statin. Eye exam is UTD, records pending. Foot exam UTD. -     Bayer DCA Hb A1c Waived  Hypertension associated with diabetes ([place]) Well controlled on current regimen. Continue HCTZ and losartan.  -     CBC with Differential/Platelet -      CMP14+EGFR  Class 1 obesity due to excess calories with serious comorbidity and body mass index (BMI) of 30.0 to 30.9 in adult Down 4 lbs since last visit. Diet and exercise.   Hyperlipidemia associated with type 2 diabetes mellitus ([place]) On statin. Labs pending. -     Lipid panel  Gastroesophageal reflux disease without esophagitis On prilosec daily, pepcid prn. Well controlled on current regimen.    Return in about 3 months (around 12/13/2021) for CPE.   The patient indicates understanding of these issues and agrees with the plan.   Gwenlyn Perking, FNP

## 2021-09-12 NOTE — Patient Instructions (Signed)

## 2021-09-13 LAB — CBC WITH DIFFERENTIAL/PLATELET
Basophils Absolute: 0.1 10*3/uL (ref 0.0–0.2)
Basos: 1 %
EOS (ABSOLUTE): 0.3 10*3/uL (ref 0.0–0.4)
Eos: 3 %
Hematocrit: 46.9 % (ref 37.5–51.0)
Hemoglobin: 15.9 g/dL (ref 13.0–17.7)
Immature Grans (Abs): 0 10*3/uL (ref 0.0–0.1)
Immature Granulocytes: 1 %
Lymphocytes Absolute: 1.9 10*3/uL (ref 0.7–3.1)
Lymphs: 23 %
MCH: 29.9 pg (ref 26.6–33.0)
MCHC: 33.9 g/dL (ref 31.5–35.7)
MCV: 88 fL (ref 79–97)
Monocytes Absolute: 0.7 10*3/uL (ref 0.1–0.9)
Monocytes: 9 %
Neutrophils Absolute: 5.4 10*3/uL (ref 1.4–7.0)
Neutrophils: 63 %
Platelets: 250 10*3/uL (ref 150–450)
RBC: 5.32 x10E6/uL (ref 4.14–5.80)
RDW: 13.3 % (ref 11.6–15.4)
WBC: 8.4 10*3/uL (ref 3.4–10.8)

## 2021-09-13 LAB — CMP14+EGFR
ALT: 23 IU/L (ref 0–44)
AST: 18 IU/L (ref 0–40)
Albumin/Globulin Ratio: 2.4 — ABNORMAL HIGH (ref 1.2–2.2)
Albumin: 4.5 g/dL (ref 3.7–4.7)
Alkaline Phosphatase: 61 IU/L (ref 44–121)
BUN/Creatinine Ratio: 9 — ABNORMAL LOW (ref 10–24)
BUN: 9 mg/dL (ref 8–27)
Bilirubin Total: 0.3 mg/dL (ref 0.0–1.2)
CO2: 19 mmol/L — ABNORMAL LOW (ref 20–29)
Calcium: 9.5 mg/dL (ref 8.6–10.2)
Chloride: 99 mmol/L (ref 96–106)
Creatinine, Ser: 1.04 mg/dL (ref 0.76–1.27)
Globulin, Total: 1.9 g/dL (ref 1.5–4.5)
Glucose: 166 mg/dL — ABNORMAL HIGH (ref 70–99)
Potassium: 4.3 mmol/L (ref 3.5–5.2)
Sodium: 138 mmol/L (ref 134–144)
Total Protein: 6.4 g/dL (ref 6.0–8.5)
eGFR: 77 mL/min/{1.73_m2} (ref >59)

## 2021-09-13 LAB — LIPID PANEL
Chol/HDL Ratio: 2.5 ratio (ref 0.0–5.0)
Cholesterol, Total: 114 mg/dL (ref 100–199)
HDL: 45 mg/dL (ref >39)
LDL Chol Calc (NIH): 41 mg/dL (ref 0–99)
Triglycerides: 168 mg/dL — ABNORMAL HIGH (ref 0–149)
VLDL Cholesterol Cal: 28 mg/dL (ref 5–40)

## 2021-09-27 ENCOUNTER — Other Ambulatory Visit: Payer: Self-pay | Admitting: Family Medicine

## 2021-09-27 DIAGNOSIS — J4540 Moderate persistent asthma, uncomplicated: Secondary | ICD-10-CM

## 2021-10-04 NOTE — Chronic Care Management (AMB) (Signed)
Erroneous encounter. Please disregard.

## 2021-10-11 DIAGNOSIS — E119 Type 2 diabetes mellitus without complications: Secondary | ICD-10-CM | POA: Diagnosis not present

## 2021-10-11 DIAGNOSIS — E1140 Type 2 diabetes mellitus with diabetic neuropathy, unspecified: Secondary | ICD-10-CM | POA: Diagnosis not present

## 2021-10-11 DIAGNOSIS — L84 Corns and callosities: Secondary | ICD-10-CM | POA: Diagnosis not present

## 2021-10-19 ENCOUNTER — Telehealth: Payer: Self-pay

## 2021-10-19 NOTE — Telephone Encounter (Signed)
Mavis with [employer] saw patient in his home today and performed PAD testing. Left foot resulted at 0.5 and right foot resulted at 0.64. She was advised to contact patients PCP with info.

## 2021-10-24 NOTE — Telephone Encounter (Signed)
I will place a referral to vascular. Does he have a preference for location?  

## 2021-10-29 ENCOUNTER — Telehealth: Payer: Self-pay | Admitting: Family Medicine

## 2021-10-29 NOTE — Telephone Encounter (Signed)
What allergy and reaction does he need an epi pen for?

## 2021-10-29 NOTE — Telephone Encounter (Signed)
  Prescription Request  10/29/2021  Is this a "Controlled Substance" medicine? NO  Have you seen your PCP in the last 2 weeks? NO  What is the name of the medication or equipment? EPI PEN  Have you contacted your pharmacy to request a refill? YES  Which pharmacy would you like this sent to?  [facility]

## 2021-10-31 NOTE — Telephone Encounter (Signed)
In case of bee stings Reactions--break out in weeps sweat itch and swelling.

## 2021-11-01 NOTE — Telephone Encounter (Signed)
Epi pens are reserved for patients who have had life threatening allergic reactions like swelling of the lips, mouth, face, or shortness of breath.

## 2021-11-14 ENCOUNTER — Other Ambulatory Visit: Payer: Self-pay | Admitting: Family Medicine

## 2021-11-14 DIAGNOSIS — M545 Low back pain, unspecified: Secondary | ICD-10-CM

## 2021-11-26 NOTE — Progress Notes (Signed)
[facility] Quality Team Note  Name: Anthony Swanson Date of Birth: 1950-03-24 MRN: [id number] Date: 11/26/2021  [facility] Quality Team has reviewed this patient's chart, please see recommendations below:  [facility] Quality Other; (KED: Kidney Health Evaluation Gap- Patient needs Urine Albumin Creatinine Ratio Test completed for gap closure. EGFR has already been completed, Patient has upcoming appointment with [facility] 12/13/2021.)

## 2021-12-06 ENCOUNTER — Other Ambulatory Visit: Payer: Self-pay | Admitting: Family Medicine

## 2021-12-06 DIAGNOSIS — E119 Type 2 diabetes mellitus without complications: Secondary | ICD-10-CM

## 2021-12-06 DIAGNOSIS — E1159 Type 2 diabetes mellitus with other circulatory complications: Secondary | ICD-10-CM

## 2021-12-06 DIAGNOSIS — E1140 Type 2 diabetes mellitus with diabetic neuropathy, unspecified: Secondary | ICD-10-CM

## 2021-12-06 DIAGNOSIS — K219 Gastro-esophageal reflux disease without esophagitis: Secondary | ICD-10-CM

## 2021-12-06 DIAGNOSIS — E1169 Type 2 diabetes mellitus with other specified complication: Secondary | ICD-10-CM

## 2021-12-13 ENCOUNTER — Encounter: Payer: Medicare Other | Admitting: Family Medicine

## 2021-12-14 ENCOUNTER — Encounter: Payer: Self-pay | Admitting: Family Medicine

## 2021-12-17 ENCOUNTER — Other Ambulatory Visit: Payer: Self-pay | Admitting: Family Medicine

## 2021-12-17 DIAGNOSIS — E1140 Type 2 diabetes mellitus with diabetic neuropathy, unspecified: Secondary | ICD-10-CM

## 2022-01-15 ENCOUNTER — Telehealth: Payer: Self-pay | Admitting: Family Medicine

## 2022-01-17 NOTE — Telephone Encounter (Signed)
Attempted to call pt , VM is full unable to leave vm

## 2022-01-17 NOTE — Telephone Encounter (Signed)
Please have him schedule an appointment to discuss. Or he can schedule an appt with podiatry to discuss.

## 2022-01-23 ENCOUNTER — Encounter: Payer: Self-pay | Admitting: Family Medicine

## 2022-01-23 ENCOUNTER — Ambulatory Visit (INDEPENDENT_AMBULATORY_CARE_PROVIDER_SITE_OTHER): Payer: Medicare Other | Admitting: Family Medicine

## 2022-01-23 DIAGNOSIS — J019 Acute sinusitis, unspecified: Secondary | ICD-10-CM

## 2022-01-23 MED ORDER — DOXYCYCLINE HYCLATE 100 MG PO TABS: 100.0000 mg | ORAL_TABLET | Freq: Two times a day (BID) | ORAL | 0 refills | Status: DC

## 2022-01-23 NOTE — Progress Notes (Signed)
Subjective:    Patient ID: Anthony Swanson, male    DOB: [birth year], 71 y.o.   MRN: [id number]   HPI: Anthony Swanson is a 71 y.o. male presenting for head stopped up. Face is swollen. No rhinorrhea. "When I get my head wet I get a sinus infection." No fever. Taking tylenol. A little cough. No PND. "I'm 71 and I know when I have a sinus infection."      09/12/2021    8:54 AM 07/25/2021    2:51 PM 07/12/2021    1:01 PM 06/11/2021   11:26 AM 04/30/2021   11:18 AM  Depression screen PHQ 2/9  Decreased Interest 0 0 0 0 0  Down, Depressed, Hopeless 0 0 0 0 0  PHQ - 2 Score 0 0 0 0 0  Altered sleeping 0  0 0 0  Tired, decreased energy 0  0 0 0  Change in appetite 0  0 0 0  Feeling bad or failure about yourself  0  0 0 0  Trouble concentrating 0  0 0 0  Moving slowly or fidgety/restless 0  0 0 0  Suicidal thoughts 0  0 0 0  PHQ-9 Score 0  0 0 0  Difficult doing work/chores Not difficult at all   Not difficult at all Not difficult at all     Relevant past medical, surgical, family and social history reviewed and updated as indicated.  Interim medical history since our last visit reviewed. Allergies and medications reviewed and updated.  ROS:  Review of Systems  Constitutional:  Negative for activity change, appetite change, chills and fever.  HENT:  Positive for congestion, postnasal drip, rhinorrhea and sinus pressure. Negative for ear discharge, ear pain, hearing loss, nosebleeds, sneezing and trouble swallowing.   Respiratory:  Negative for chest tightness and shortness of breath.   Cardiovascular:  Negative for chest pain and palpitations.  Skin:  Negative for rash.     Social History   Tobacco Use  Smoking Status Former   Packs/day: 1.00   Years: 6.00   Total pack years: 6.00   Types: Cigarettes   Quit date: 1977   Years since quitting: 46.9  Smokeless Tobacco Current   Types: Chew       Objective:     Wt Readings from Last 3 Encounters:  09/12/21 188 lb 4 oz  (85.4 kg)  07/25/21 192 lb 2 oz (87.1 kg)  07/16/21 194 lb (88 kg)     Exam deferred. Pt. Harbor[place] due to COVID 19. Phone visit performed.   Assessment & Plan:   1. Acute sinusitis, recurrence not specified, unspecified location     Meds ordered this encounter  Medications   doxycycline (VIBRA-TABS) 100 MG tablet    Sig: Take 1 tablet (100 mg total) by mouth 2 (two) times daily.    Dispense:  20 tablet    Refill:  0    No orders of the defined types were placed in this encounter.     Diagnoses and all orders for this visit:  Acute sinusitis, recurrence not specified, unspecified location -     doxycycline (VIBRA-TABS) 100 MG tablet; Take 1 tablet (100 mg total) by mouth 2 (two) times daily.    Virtual Visit via telephone Note  I discussed the limitations, risks, security and privacy concerns of performing an evaluation and management service by telephone and the availability of in person appointments. The patient was identified with two identifiers. Pt.expressed understanding and  agreed to proceed. Pt. Is at home. Dr. Darlyn Read is in his office.  Follow Up Instructions:   I discussed the assessment and treatment plan with the patient. The patient was provided an opportunity to ask questions and all were answered. The patient agreed with the plan and demonstrated an understanding of the instructions.   The patient was advised to call back or seek an in-person evaluation if the symptoms worsen or if the condition fails to improve as anticipated.   Total minutes including chart review and phone contact time: 8   Follow up plan: Return if symptoms worsen or fail to improve.  Mechele Claude, MD [place] [facility]

## 2022-02-27 ENCOUNTER — Ambulatory Visit (INDEPENDENT_AMBULATORY_CARE_PROVIDER_SITE_OTHER): Payer: Medicare Other | Admitting: Family Medicine

## 2022-02-27 ENCOUNTER — Encounter: Payer: Self-pay | Admitting: Family Medicine

## 2022-02-27 VITALS — BP 124/81 | HR 93 | Temp 98.8°F | Ht 66.0 in | Wt 193.0 lb

## 2022-02-27 DIAGNOSIS — I152 Hypertension secondary to endocrine disorders: Secondary | ICD-10-CM

## 2022-02-27 DIAGNOSIS — E785 Hyperlipidemia, unspecified: Secondary | ICD-10-CM | POA: Diagnosis not present

## 2022-02-27 DIAGNOSIS — F5104 Psychophysiologic insomnia: Secondary | ICD-10-CM

## 2022-02-27 DIAGNOSIS — K219 Gastro-esophageal reflux disease without esophagitis: Secondary | ICD-10-CM

## 2022-02-27 DIAGNOSIS — E1169 Type 2 diabetes mellitus with other specified complication: Secondary | ICD-10-CM | POA: Diagnosis not present

## 2022-02-27 DIAGNOSIS — F5101 Primary insomnia: Secondary | ICD-10-CM

## 2022-02-27 DIAGNOSIS — Z6831 Body mass index (BMI) 31.0-31.9, adult: Secondary | ICD-10-CM

## 2022-02-27 DIAGNOSIS — J4540 Moderate persistent asthma, uncomplicated: Secondary | ICD-10-CM

## 2022-02-27 DIAGNOSIS — E1159 Type 2 diabetes mellitus with other circulatory complications: Secondary | ICD-10-CM

## 2022-02-27 DIAGNOSIS — E1140 Type 2 diabetes mellitus with diabetic neuropathy, unspecified: Secondary | ICD-10-CM

## 2022-02-27 DIAGNOSIS — E6609 Other obesity due to excess calories: Secondary | ICD-10-CM

## 2022-02-27 MED ORDER — RYBELSUS 14 MG PO TABS: 14.0000 mg | ORAL_TABLET | Freq: Every day | ORAL | 1 refills | Status: DC

## 2022-02-27 MED ORDER — ALBUTEROL SULFATE HFA 108 (90 BASE) MCG/ACT IN AERS: INHALATION_SPRAY | RESPIRATORY_TRACT | 5 refills | Status: DC

## 2022-02-27 MED ORDER — HYDROXYZINE HCL 10 MG PO TABS: 10.0000 mg | ORAL_TABLET | Freq: Every evening | ORAL | 1 refills | Status: DC | PRN

## 2022-02-27 NOTE — Progress Notes (Unsigned)
Established Patient Office Visit  Subjective   Patient ID: Anthony Swanson, male    DOB: [birth year]  Age: 71 y.o. MRN: [id number]  Chief Complaint  Patient presents with   Medical Management of Chronic Issues    HPI DM Pt presents for follow up of evaluation of Type 2 diabetes mellitus.  Current symptoms include neuropathy of feed. Patient denies foot ulcerations, hypoglycemia , increased appetite, nausea, polydipsia, polyuria, visual disturbances, vomiting, and weight loss.  Current diabetic medications include jardiance 25 mg, rybelsus 7 mg, glipizide 10 mg BID Compliant with meds - Yes  Current monitoring regimen: home blood tests - 1 times daily Home blood sugar records: fasting range: 98-108 Any episodes of hypoglycemia? no  Urine microalbumin UTD? No Is He on ACE inhibitor or angiotensin II receptor blocker?  Yes, losartan Is He on statin? Yes simvastatin  He stopped taking gabapentin for neuropathy. He reports that his symptoms are manageable at this time.   2. HTN Complaint with meds - Yes Current Medications - HCTZ 12.5, losartan 50 mg Pertinent ROS:  Headache - No Fatigue - No Visual Disturbances - No Chest pain - No Dyspnea - No Palpitations - No LE edema - No  3. Difficulty sleeping He continues to have difficulty sleeping, mostly with falling asleep. He has failed trazodone in the past. He used to take lorazepam with good relief.    4. GERD Compliant with medications - prilosec, pepcid  Cough - No Sore throat - No Voice change - No Hemoptysis - No Dysphagia or dyspepsia - No Water brash - No Red Flags (weight loss, hematochezia, melena, weight loss, early satiety, fevers, odynophagia, or persistent vomiting) - No  5. Asthma Reports rare need for albuterol.   Past Medical History:  Diagnosis Date   Allergy    Arthritis    Asthma    Diabetes mellitus    Hyperlipidemia    Hypertension    Myocardial infarction ([place])       ROS As per HPI.    Objective:     BP 124/81   Pulse 93   Temp 98.8 F (37.1 C)   Ht _0  (1.676 m)   Wt 193 lb (87.5 kg)   SpO2 99%   BMI 31.15 kg/m  BP Readings from Last 3 Encounters:  02/27/22 124/81  09/12/21 108/67  07/25/21 114/65   Wt Readings from Last 3 Encounters:  02/27/22 193 lb (87.5 kg)  09/12/21 188 lb 4 oz (85.4 kg)  07/25/21 192 lb 2 oz (87.1 kg)      Physical Exam Vitals and nursing note reviewed.  Constitutional:      General: He is not in acute distress.    Appearance: He is not ill-appearing, toxic-appearing or diaphoretic.  HENT:     Mouth/Throat:     Mouth: Mucous membranes are moist.     Pharynx: Oropharynx is clear.  Cardiovascular:     Rate and Rhythm: Normal rate and regular rhythm.     Heart sounds: Normal heart sounds. No murmur heard. Pulmonary:     Effort: Pulmonary effort is normal. No respiratory distress.     Breath sounds: Normal breath sounds.  Abdominal:     General: Bowel sounds are normal. There is no distension.     Palpations: Abdomen is soft.     Tenderness: There is no abdominal tenderness. There is no guarding or rebound.  Musculoskeletal:     Right lower leg: No edema.     Left  lower leg: No edema.  Skin:    General: Skin is warm and dry.  Neurological:     General: No focal deficit present.     Mental Status: He is alert and oriented to person, place, and time.  Psychiatric:        Mood and Affect: Mood normal.        Behavior: Behavior normal.      No results found for any visits on 02/27/22.    The ASCVD Risk score (Arnett DK, et al., 2019) failed to calculate for the following reasons:   The patient has a prior MI or stroke diagnosis    Assessment & Plan:   Miken was seen today for medical management of chronic issues.  Diagnoses and all orders for this visit:  Type 2 diabetes mellitus with diabetic neuropathy, without long-term current use of insulin (HCC) Uncontrolled. A1c 7.9 today. Not at goal of <7.  Increase rybelsus to 14 mg daily. Continue jardiance. Foot exam, eye exam UTD. Urine micro today. On ARB and statin.  -     Bayer DCA Hb A1c Waived -     CBC with Differential/Platelet -     CMP14+EGFR -     Lipid panel -     Microalbumin / creatinine urine ratio -     Semaglutide (RYBELSUS) 14 MG TABS; Take 1 tablet (14 mg total) by mouth daily.  Hyperlipidemia associated with type 2 diabetes mellitus (HCC) Lipid panel pending. On simvastatin.   Hypertension associated with diabetes ([place]) Well controlled on current regimen. Continue losartan, HCTZ.   Moderate persistent asthma without complication Well controlled on current regimen.  -     albuterol (VENTOLIN HFA) 108 (90 Base) MCG/ACT inhaler; INHALE TWO PUFFS BY MOUTH EVERY 6 HOURS AS NEEDED FOR SHORTNESS OF BREATH  Class 1 obesity due to excess calories with serious comorbidity and body mass index (BMI) of 31.0 to 31.9 in adult Diet and exercise.   Gastroesophageal reflux disease without esophagitis Well controlled on current regimen.   Chronic insomnia Failed trazodone. Will try hydroxyzine prn as lorazepam has been helpful in the past.  -     hydrOXYzine (ATARAX) 10 MG tablet; Take 1 tablet (10 mg total) by mouth at bedtime as needed.  Return in about 3 months (around 05/29/2022) for chronic follow up.   The patient indicates understanding of these issues and agrees with the plan.  Gwenlyn Perking, FNP

## 2022-02-28 LAB — CBC WITH DIFFERENTIAL/PLATELET
Basophils Absolute: 0.1 10*3/uL (ref 0.0–0.2)
Basos: 1 %
EOS (ABSOLUTE): 0.3 10*3/uL (ref 0.0–0.4)
Eos: 3 %
Hematocrit: 50.2 % (ref 37.5–51.0)
Hemoglobin: 16.8 g/dL (ref 13.0–17.7)
Immature Grans (Abs): 0.2 10*3/uL — ABNORMAL HIGH (ref 0.0–0.1)
Immature Granulocytes: 1 %
Lymphocytes Absolute: 2.9 10*3/uL (ref 0.7–3.1)
Lymphs: 27 %
MCH: 29.8 pg (ref 26.6–33.0)
MCHC: 33.5 g/dL (ref 31.5–35.7)
MCV: 89 fL (ref 79–97)
Monocytes Absolute: 0.9 10*3/uL (ref 0.1–0.9)
Monocytes: 8 %
Neutrophils Absolute: 6.6 10*3/uL (ref 1.4–7.0)
Neutrophils: 60 %
Platelets: 289 10*3/uL (ref 150–450)
RBC: 5.64 x10E6/uL (ref 4.14–5.80)
RDW: 13 % (ref 11.6–15.4)
WBC: 11 10*3/uL — ABNORMAL HIGH (ref 3.4–10.8)

## 2022-02-28 LAB — CMP14+EGFR
ALT: 30 IU/L (ref 0–44)
AST: 23 IU/L (ref 0–40)
Albumin/Globulin Ratio: 2.1 (ref 1.2–2.2)
Albumin: 4.7 g/dL (ref 3.8–4.8)
Alkaline Phosphatase: 57 IU/L (ref 44–121)
BUN/Creatinine Ratio: 9 — ABNORMAL LOW (ref 10–24)
BUN: 10 mg/dL (ref 8–27)
Bilirubin Total: 0.3 mg/dL (ref 0.0–1.2)
CO2: 24 mmol/L (ref 20–29)
Calcium: 9.8 mg/dL (ref 8.6–10.2)
Chloride: 101 mmol/L (ref 96–106)
Creatinine, Ser: 1.11 mg/dL (ref 0.76–1.27)
Globulin, Total: 2.2 g/dL (ref 1.5–4.5)
Glucose: 126 mg/dL — ABNORMAL HIGH (ref 70–99)
Potassium: 4.4 mmol/L (ref 3.5–5.2)
Sodium: 141 mmol/L (ref 134–144)
Total Protein: 6.9 g/dL (ref 6.0–8.5)
eGFR: 71 mL/min/{1.73_m2} (ref >59)

## 2022-02-28 LAB — MICROALBUMIN / CREATININE URINE RATIO
Creatinine, Urine: 43.1 mg/dL
Microalb/Creat Ratio: 10 mg/g creat (ref 0–29)
Microalbumin, Urine: 4.1 ug/mL

## 2022-02-28 LAB — LIPID PANEL
Chol/HDL Ratio: 2.5 ratio (ref 0.0–5.0)
Cholesterol, Total: 138 mg/dL (ref 100–199)
HDL: 55 mg/dL (ref >39)
LDL Chol Calc (NIH): 60 mg/dL (ref 0–99)
Triglycerides: 131 mg/dL (ref 0–149)
VLDL Cholesterol Cal: 23 mg/dL (ref 5–40)

## 2022-02-28 LAB — BAYER DCA HB A1C WAIVED: HB A1C (BAYER DCA - WAIVED): 7.9 % — ABNORMAL HIGH (ref 4.8–5.6)

## 2022-03-01 ENCOUNTER — Other Ambulatory Visit: Payer: Self-pay | Admitting: Family Medicine

## 2022-03-01 ENCOUNTER — Other Ambulatory Visit: Payer: Self-pay | Admitting: Nurse Practitioner

## 2022-03-01 DIAGNOSIS — K219 Gastro-esophageal reflux disease without esophagitis: Secondary | ICD-10-CM

## 2022-03-01 DIAGNOSIS — E1159 Type 2 diabetes mellitus with other circulatory complications: Secondary | ICD-10-CM

## 2022-03-01 DIAGNOSIS — E119 Type 2 diabetes mellitus without complications: Secondary | ICD-10-CM

## 2022-03-01 DIAGNOSIS — E1169 Type 2 diabetes mellitus with other specified complication: Secondary | ICD-10-CM

## 2022-03-12 ENCOUNTER — Telehealth: Payer: Self-pay | Admitting: Family Medicine

## 2022-03-12 NOTE — Telephone Encounter (Signed)
Patient wants to speak with nurse regarding his lab results he received in the mail.

## 2022-03-12 NOTE — Telephone Encounter (Signed)
Pt aware labs are stable per Marjorie Smolder.

## 2022-04-25 ENCOUNTER — Ambulatory Visit (INDEPENDENT_AMBULATORY_CARE_PROVIDER_SITE_OTHER): Payer: 59

## 2022-04-25 VITALS — Ht 66.0 in | Wt 193.0 lb

## 2022-04-25 DIAGNOSIS — Z0000 Encounter for general adult medical examination without abnormal findings: Secondary | ICD-10-CM

## 2022-04-25 NOTE — Progress Notes (Signed)
Subjective:   Anthony Swanson is a 72 y.o. male who presents for Medicare Annual/Subsequent preventive examination. I connected with  Anthony Swanson on 04/25/22 by a audio enabled telemedicine application and verified that I am speaking with the correct person using two identifiers.  Patient Location: Home  Provider Location: Home Office  I discussed the limitations of evaluation and management by telemedicine. The patient expressed understanding and agreed to proceed.  Review of Systems     Cardiac Risk Factors include: advanced age (>64mn, >>41women);male gender;hypertension;diabetes mellitus     Objective:    Today's Vitals   04/25/22 1115  Weight: 193 lb (87.5 kg)  Height: 5' 6"$  (1.676 m)   Body mass index is 31.15 kg/m.     04/25/2022   11:18 AM 04/24/2021   12:45 PM  Advanced Directives  Does Patient Have a Medical Advance Directive? No No  Would patient like information on creating a medical advance directive? No - Patient declined No - Patient declined    Current Medications (verified) Outpatient Encounter Medications as of 04/25/2022  Medication Sig   ACCU-CHEK GUIDE test strip Test BS daily Dx E11.9   Accu-Chek Softclix Lancets lancets USE TO TEST BLOOD SUGAR DAILY Dx E11.40   acetaminophen (TYLENOL) 500 MG tablet Take 500 mg by mouth every 6 (six) hours as needed. For pain   albuterol (PROVENTIL) (2.5 MG/3ML) 0.083% nebulizer solution SMARTSIG:1 Vial(s) Via Nebulizer 4 Times Daily PRN   albuterol (VENTOLIN HFA) 108 (90 Base) MCG/ACT inhaler INHALE TWO PUFFS BY MOUTH EVERY 6 HOURS AS NEEDED FOR SHORTNESS OF BREATH   empagliflozin (JARDIANCE) 25 MG TABS tablet Take 1 tablet (25 mg total) by mouth daily before breakfast.   famotidine (PEPCID) 20 MG tablet TAKE 1 TABLET BY MOUTH EVERY DAY AFTER DINNER   gabapentin (NEURONTIN) 600 MG tablet TAKE 1 TABLET BY MOUTH TWICE DAILY   glipiZIDE (GLUCOTROL) 10 MG tablet TAKE 1 TABLET BY MOUTH TWICE DAILY    hydrochlorothiazide (HYDRODIURIL) 12.5 MG tablet Take 1 tablet (12.5 mg total) by mouth daily.   hydrOXYzine (ATARAX) 10 MG tablet Take 1 tablet (10 mg total) by mouth at bedtime as needed.   lidocaine (XYLOCAINE) 5 % ointment apply ONE application topically AS NEEDED   losartan (COZAAR) 50 MG tablet TAKE 1 TABLET BY MOUTH EVERY DAY   omeprazole (PRILOSEC) 40 MG capsule TAKE ONE CAPSULE BY MOUTH EVERY MORNING   Semaglutide (RYBELSUS) 14 MG TABS Take 1 tablet (14 mg total) by mouth daily.   simvastatin (ZOCOR) 20 MG tablet TAKE 1 TABLET BY MOUTH EVERY MORNING   No facility-administered encounter medications on file as of 04/25/2022.    Allergies (verified) Codeine, Penicillins, Percocet [oxycodone-acetaminophen], and Sulfa antibiotics   History: Past Medical History:  Diagnosis Date   Allergy    Arthritis    Asthma    Diabetes mellitus    Hyperlipidemia    Hypertension    Myocardial infarction ([facility]    Past Surgical History:  Procedure Laterality Date   APPENDECTOMY  2020   Family History  Problem Relation Age of Onset   Heart attack Mother    Stroke Father    Cancer Brother    COPD Brother    Social History   Socioeconomic History   Marital status: Widowed    Spouse name: Not on file   Number of children: 4   Years of education: 8   Highest education level: 8th grade  Occupational History   Occupation: retired  Tobacco Use   Smoking status: Former    Packs/day: 1.00    Years: 6.00    Total pack years: 6.00    Types: Cigarettes    Quit date: 1    Years since quitting: 47.1   Smokeless tobacco: Current    Types: Chew  Vaping Use   Vaping Use: Never used  Substance and Sexual Activity   Alcohol use: No   Drug use: No   Sexual activity: Not Currently    Birth control/protection: None  Other Topics Concern   Not on file  Social History Narrative   Lives alone - no family around here   Goes to church   4 children - they don't visit much   Social  Determinants of Health   Financial Resource Strain: Low Risk  (04/25/2022)   Overall Financial Resource Strain (CARDIA)    Difficulty of Paying Living Expenses: Not hard at all  Food Insecurity: No Food Insecurity (04/25/2022)   Hunger Vital Sign    Worried About Running Out of Food in the Last Year: Never true    Ran Out of Food in the Last Year: Never true  Transportation Needs: No Transportation Needs (04/25/2022)   PRAPARE - Hydrologist (Medical): No    Lack of Transportation (Non-Medical): No  Physical Activity: Inactive (04/25/2022)   Exercise Vital Sign    Days of Exercise per Week: 0 days    Minutes of Exercise per Session: 0 min  Stress: No Stress Concern Present (04/25/2022)   [place]    Feeling of Stress : Not at all  Social Connections: Socially Isolated (04/25/2022)   Social Connection and Isolation Panel [NHANES]    Frequency of Communication with Friends and Family: More than three times a week    Frequency of Social Gatherings with Friends and Family: More than three times a week    Attends Religious Services: Never    Marine scientist or Organizations: No    Attends Archivist Meetings: Never    Marital Status: Widowed    Tobacco Counseling Ready to quit: Not Answered Counseling given: Not Answered   Clinical Intake:  Pre-visit preparation completed: Yes  Pain : No/denies pain     Nutritional Risks: None Diabetes: Yes CBG done?: No Did pt. bring in CBG monitor from home?: No  How often do you need to have someone help you when you read instructions, pamphlets, or other written materials from your doctor or pharmacy?: 1 - Never  Diabetic?yes Nutrition Risk Assessment:  Has the patient had any N/V/D within the last 2 months?  No  Does the patient have any non-healing wounds?  No  Has the patient had any unintentional weight loss or weight  gain?  No   Diabetes:  Is the patient diabetic?  Yes  If diabetic, was a CBG obtained today?  No  Did the patient bring in their glucometer from home?  No  How often do you monitor your CBG's? 2 x week .   Financial Strains and Diabetes Management:  Are you having any financial strains with the device, your supplies or your medication? No .  Does the patient want to be seen by Chronic Care Management for management of their diabetes?  No  Would the patient like to be referred to a Nutritionist or for Diabetic Management?  No   Diabetic Exams:  Diabetic Eye Exam: Completed 08/2021 Diabetic  Foot Exam: Overdue, Pt has been advised about the importance in completing this exam. Pt is scheduled for diabetic foot exam on next office visit .   Interpreter Needed?: No  Information entered by :: Jadene Pierini, LPN   Activities of Daily Living    04/25/2022   11:18 AM  In your present state of health, do you have any difficulty performing the following activities:  Hearing? 0  Vision? 0  Difficulty concentrating or making decisions? 0  Walking or climbing stairs? 0  Dressing or bathing? 0  Doing errands, shopping? 0  Preparing Food and eating ? N  Using the Toilet? N  In the past six months, have you accidently leaked urine? N  Do you have problems with loss of bowel control? N  Managing your Medications? N  Managing your Finances? N  Housekeeping or managing your Housekeeping? N    Patient Care Team: Gwenlyn Perking, FNP as PCP - General (Family Medicine) Celestia Khat, Man (Optometry) Blanca Friend Royce Macadamia, [facility] [place] as Pharmacist (Family Medicine)  Indicate any recent Medical Services you may have received from other than [facility] providers in the past year (date may be approximate).     Assessment:   This is a routine wellness examination for Daral.  Hearing/Vision screen Vision Screening - Comments:: Wears rx glasses - up to date with routine eye exams with  Dr.johnson   Dietary  issues and exercise activities discussed: Current Exercise Habits: The patient does not participate in regular exercise at present   Goals Addressed             This Visit's Progress    DIET - INCREASE WATER INTAKE         Depression Screen    04/25/2022   11:17 AM 02/27/2022    3:42 PM 09/12/2021    8:54 AM 07/25/2021    2:51 PM 07/12/2021    1:01 PM 06/11/2021   11:26 AM 04/30/2021   11:18 AM  PHQ 2/9 Scores  PHQ - 2 Score 0 0 0 0 0 0 0  PHQ- 9 Score 0 0 0  0 0 0    Fall Risk    04/25/2022   11:16 AM 02/27/2022    3:42 PM 09/12/2021    8:54 AM 07/25/2021    2:50 PM 06/11/2021   11:26 AM  Fall Risk   Falls in the past year? 1 0 0 1 0  Number falls in past yr: 1 0  0   Injury with Fall? 1 0  1   Risk for fall due to : History of fall(s);Impaired balance/gait;Orthopedic patient No Fall Risks  History of fall(s)   Follow up Education provided;Falls prevention discussed Education provided  Falls evaluation completed     FALL RISK PREVENTION PERTAINING TO THE HOME:  Any stairs in or around the home? No  If so, are there any without handrails? No  Home free of loose throw rugs in walkways, pet beds, electrical cords, etc? Yes  Adequate lighting in your home to reduce risk of falls? Yes   ASSISTIVE DEVICES UTILIZED TO PREVENT FALLS:  Life alert? No  Use of a cane, walker or w/c? No  Grab bars in the bathroom? Yes  Shower chair or bench in shower? Yes  Elevated toilet seat or a handicapped toilet? Yes          04/25/2022   11:18 AM 04/24/2021   12:24 PM  6CIT Screen  What Year? 0 points 0 points  What month? 0 points 0 points  What time? 0 points 0 points  Count back from 20 0 points 0 points  Months in reverse 0 points 4 points  Repeat phrase 0 points 0 points  Total Score 0 points 4 points    Immunizations Immunization History  Administered Date(s) Administered   Fluad Quad(high Dose 65+) 05/17/2020   Moderna Sars-Covid-2 Vaccination 07/13/2019, 08/17/2019    Pneumococcal Conjugate-13 06/05/2016   Tdap 01/18/1999, 01/02/2005, 08/14/2016, 03/10/2021   Zoster Recombinat (Shingrix) 06/05/2016, 08/14/2016    TDAP status: Up to date  Flu Vaccine status: Due, Education has been provided regarding the importance of this vaccine. Advised may receive this vaccine at local pharmacy or Health Dept. Aware to provide a copy of the vaccination record if obtained from local pharmacy or Health Dept. Verbalized acceptance and understanding.  Pneumococcal vaccine status: Up to date  Covid-19 vaccine status: Completed vaccines  Qualifies for Shingles Vaccine? Yes   Zostavax completed Yes   Shingrix Completed?: Yes  Screening Tests Health Maintenance  Topic Date Due   COVID-19 Vaccine (3 - 2023-24 season) 11/09/2021   OPHTHALMOLOGY EXAM  03/14/2022   INFLUENZA VACCINE  06/09/2022 (Originally 10/09/2021)   Hepatitis C Screening  07/26/2022 (Originally 11/10/1968)   Pneumonia Vaccine 41+ Years old (2 of 2 - PPSV23 or PCV20) 02/28/2023 (Originally 06/05/2017)   FOOT EXAM  07/13/2022   HEMOGLOBIN A1C  08/29/2022   Diabetic kidney evaluation - eGFR measurement  02/28/2023   Diabetic kidney evaluation - Urine ACR  02/28/2023   Medicare Annual Wellness (AWV)  04/26/2023   DTaP/Tdap/Td (5 - Td or Tdap) 03/11/2031   Zoster Vaccines- Shingrix  Completed   HPV VACCINES  Aged Out   COLONOSCOPY (Pts 45-15yr Insurance coverage will need to be confirmed)  Discontinued    Health Maintenance  Health Maintenance Due  Topic Date Due   COVID-19 Vaccine (3 - 2023-24 season) 11/09/2021   OPHTHALMOLOGY EXAM  03/14/2022    Colorectal cancer screening: Referral to GI placed declined . Pt aware the office will call re: appt.  Lung Cancer Screening: (Low Dose CT Chest recommended if Age 73-80years, 30 pack-year currently smoking OR have quit w/in 15years.) does not qualify.   Lung Cancer Screening Referral: n/a  Additional Screening:  Hepatitis C Screening: does not  qualify;  Vision Screening: Recommended annual ophthalmology exams for early detection of glaucoma and other disorders of the eye. Is the patient up to date with their annual eye exam?  Yes  Who is the provider or what is the name of the office in which the patient attends annual eye exams? Dr.Johnson  If pt is not established with a provider, would they like to be referred to a provider to establish care? No .   Dental Screening: Recommended annual dental exams for proper oral hygiene  Community Resource Referral / Chronic Care Management: CRR required this visit?  No   CCM required this visit?  No      Plan:     I have personally reviewed and noted the following in the patient's chart:   Medical and social history Use of alcohol, tobacco or illicit drugs  Current medications and supplements including opioid prescriptions. Patient is not currently taking opioid prescriptions. Functional ability and status Nutritional status Physical activity Advanced directives List of other physicians Hospitalizations, surgeries, and ER visits in previous 12 months Vitals Screenings to include cognitive, depression, and falls Referrals and appointments  In addition, I have reviewed and discussed  with patient certain preventive protocols, quality metrics, and best practice recommendations. A written personalized care plan for preventive services as well as general preventive health recommendations were provided to patient.     Daphane Shepherd, LPN   [id number]   Nurse Notes: Declined Colonoscopy

## 2022-04-25 NOTE — Patient Instructions (Signed)
[name] , Thank you for taking time to come for your Medicare Wellness Visit. I appreciate your ongoing commitment to your health goals. Please review the following plan we discussed and let me know if I can assist you in the future.   These are the goals we discussed:  Goals       DIET - INCREASE WATER INTAKE      LIFESTYLE - DECREASE FALLS RISK      Try to obtain walker with 4 wheels and seat, shower chair, elevated toilet seat, and life alert      T2DM, CKD (pt-stated)      Current Barriers:  Unable to independently afford treatment regimen Unable to maintain control of T2DM Suboptimal therapeutic regimen for T2DM  Pharmacist Clinical Goal(s):  patient will verbalize ability to afford treatment regimen maintain control of T2DM as evidenced by GOAL A1C<7%  through collaboration with PharmD and provider.    Interventions: 1:1 collaboration with Anthony Perking, FNP regarding development and update of comprehensive plan of care as evidenced by provider attestation and co-signature Inter-disciplinary care team collaboration (see longitudinal plan of care) Comprehensive medication review performed; medication list updated in electronic medical record  Diabetes: New goal. 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, continue Continue rybelsus 71m & OTHER MEDS AS ABOVE Don't cut in half, don't take with coffee Take with water Denies personal and family history of Medullary thyroid cancer (MTC) Patient states they are affordable Denies hypoglycemic/hyperglycemic symptoms Discussed meal planning options and Plate method for healthy eating Avoid sugary drinks and desserts Incorporate balanced protein, non starchy veggies, 1 serving of carbohydrate with each meal Increase water intake Increase physical activity as able Current exercise: ENCOURAGED Recommended continue current regimen  Assessed patient  finances. Patient to bring financials if rybelsus/jardiance is not affrodabale   Patient Goals/Self-Care Activities patient will:  - take medications as prescribed as evidenced by patient report and record review check glucose daily fasting or if symptomatic, document, and provide at future appointments collaborate with provider on medication access solutions target a minimum of 150 minutes of moderate intensity exercise weekly engage in dietary modifications by FOLLOWING A HEART HEALTHY DIET/HEALTHY PLATE METHOD          This is a list of the screening recommended for you and due dates:  Health Maintenance  Topic Date Due   COVID-19 Vaccine (3 - 2023-24 season) 11/09/2021   Eye exam for diabetics  03/14/2022   Flu Shot  06/09/2022*   Hepatitis C Screening: USPSTF Recommendation to screen - Ages 18-79 yo.  07/26/2022*   Pneumonia Vaccine (2 of 2 - PPSV23 or PCV20) 02/28/2023*   Complete foot exam   07/13/2022   Hemoglobin A1C  08/29/2022   Yearly kidney function blood test for diabetes  02/28/2023   Yearly kidney health urinalysis for diabetes  02/28/2023   Medicare Annual Wellness Visit  04/26/2023   DTaP/Tdap/Td vaccine (5 - Td or Tdap) 03/11/2031   Zoster (Shingles) Vaccine  Completed   HPV Vaccine  Aged Out   Colon Cancer Screening  Discontinued  *Topic was postponed. The date shown is not the original due date.    Advanced directives: Advance directive discussed with you today. I have provided a copy for you to complete at home and have notarized. Once this is complete please bring a copy in to our office so we can scan it into your chart.   Conditions/risks identified: Aim  for 30 minutes of exercise or brisk walking, 6-8 glasses of water, and 5 servings of fruits and vegetables each day.   Next appointment: Follow up in one year for your annual wellness visit.   Preventive Care 87 Years and Older, Male  Preventive care refers to lifestyle choices and visits with your  health care provider that can promote health and wellness. What does preventive care include? A yearly physical exam. This is also called an annual well check. Dental exams once or twice a year. Routine eye exams. Ask your health care provider how often you should have your eyes checked. Personal lifestyle choices, including: Daily care of your teeth and gums. Regular physical activity. Eating a healthy diet. Avoiding tobacco and drug use. Limiting alcohol use. Practicing safe sex. Taking low doses of aspirin every day. Taking vitamin and mineral supplements as recommended by your health care provider. What happens during an annual well check? The services and screenings done by your health care provider during your annual well check will depend on your age, overall health, lifestyle risk factors, and family history of disease. Counseling  Your health care provider may ask you questions about your: Alcohol use. Tobacco use. Drug use. Emotional well-being. Home and relationship well-being. Sexual activity. Eating habits. History of falls. Memory and ability to understand (cognition). Work and work Statistician. Screening  You may have the following tests or measurements: Height, weight, and BMI. Blood pressure. Lipid and cholesterol levels. These may be checked every 5 years, or more frequently if you are over 6 years old. Skin check. Lung cancer screening. You may have this screening every year starting at age 51 if you have a 30-pack-year history of smoking and currently smoke or have quit within the past 15 years. Fecal occult blood test (FOBT) of the stool. You may have this test every year starting at age 64. Flexible sigmoidoscopy or colonoscopy. You may have a sigmoidoscopy every 5 years or a colonoscopy every 10 years starting at age 59. Prostate cancer screening. Recommendations will vary depending on your family history and other risks. Hepatitis C blood  test. Hepatitis B blood test. Sexually transmitted disease (STD) testing. Diabetes screening. This is done by checking your blood sugar (glucose) after you have not eaten for a while (fasting). You may have this done every 1-3 years. Abdominal aortic aneurysm (AAA) screening. You may need this if you are a current or former smoker. Osteoporosis. You may be screened starting at age 27 if you are at high risk. Talk with your health care provider about your test results, treatment options, and if necessary, the need for more tests. Vaccines  Your health care provider may recommend certain vaccines, such as: Influenza vaccine. This is recommended every year. Tetanus, diphtheria, and acellular pertussis (Tdap, Td) vaccine. You may need a Td booster every 10 years. Zoster vaccine. You may need this after age 53. Pneumococcal 13-valent conjugate (PCV13) vaccine. One dose is recommended after age 8. Pneumococcal polysaccharide (PPSV23) vaccine. One dose is recommended after age 71. Talk to your health care provider about which screenings and vaccines you need and how often you need them. This information is not intended to replace advice given to you by your health care provider. Make sure you discuss any questions you have with your health care provider. Document Released: 03/24/2015 Document Revised: 11/15/2015 Document Reviewed: 12/27/2014 Elsevier Interactive Patient Education  2017 [employer] Prevention in the Home Falls can cause injuries. They can happen to people  of all ages. There are many things you can do to make your home safe and to help prevent falls. What can I do on the outside of my home? Regularly fix the edges of walkways and driveways and fix any cracks. Remove anything that might make you trip as you walk through a door, such as a raised step or threshold. Trim any bushes or trees on the path to your home. Use bright outdoor lighting. Clear any walking paths of  anything that might make someone trip, such as rocks or tools. Regularly check to see if handrails are loose or broken. Make sure that both sides of any steps have handrails. Any raised decks and porches should have guardrails on the edges. Have any leaves, snow, or ice cleared regularly. Use sand or salt on walking paths during winter. Clean up any spills in your garage right away. This includes oil or grease spills. What can I do in the bathroom? Use night lights. Install grab bars by the toilet and in the tub and shower. Do not use towel bars as grab bars. Use non-skid mats or decals in the tub or shower. If you need to sit down in the shower, use a plastic, non-slip stool. Keep the floor dry. Clean up any water that spills on the floor as soon as it happens. Remove soap buildup in the tub or shower regularly. Attach bath mats securely with double-sided non-slip rug tape. Do not have throw rugs and other things on the floor that can make you trip. What can I do in the bedroom? Use night lights. Make sure that you have a light by your bed that is easy to reach. Do not use any sheets or blankets that are too big for your bed. They should not hang down onto the floor. Have a firm chair that has side arms. You can use this for support while you get dressed. Do not have throw rugs and other things on the floor that can make you trip. What can I do in the kitchen? Clean up any spills right away. Avoid walking on wet floors. Keep items that you use a lot in easy-to-reach places. If you need to reach something above you, use a strong step stool that has a grab bar. Keep electrical cords out of the way. Do not use floor polish or wax that makes floors slippery. If you must use wax, use non-skid floor wax. Do not have throw rugs and other things on the floor that can make you trip. What can I do with my stairs? Do not leave any items on the stairs. Make sure that there are handrails on both  sides of the stairs and use them. Fix handrails that are broken or loose. Make sure that handrails are as long as the stairways. Check any carpeting to make sure that it is firmly attached to the stairs. Fix any carpet that is loose or worn. Avoid having throw rugs at the top or bottom of the stairs. If you do have throw rugs, attach them to the floor with carpet tape. Make sure that you have a light switch at the top of the stairs and the bottom of the stairs. If you do not have them, ask someone to add them for you. What else can I do to help prevent falls? Wear shoes that: Do not have high heels. Have rubber bottoms. Are comfortable and fit you well. Are closed at the toe. Do not wear sandals. If you use  a stepladder: Make sure that it is fully opened. Do not climb a closed stepladder. Make sure that both sides of the stepladder are locked into place. Ask someone to hold it for you, if possible. Clearly mark and make sure that you can see: Any grab bars or handrails. First and last steps. Where the edge of each step is. Use tools that help you move around (mobility aids) if they are needed. These include: Canes. Walkers. Scooters. Crutches. Turn on the lights when you go into a dark area. Replace any light bulbs as soon as they burn out. Set up your furniture so you have a clear path. Avoid moving your furniture around. If any of your floors are uneven, fix them. If there are any pets around you, be aware of where they are. Review your medicines with your doctor. Some medicines can make you feel dizzy. This can increase your chance of falling. Ask your doctor what other things that you can do to help prevent falls. This information is not intended to replace advice given to you by your health care provider. Make sure you discuss any questions you have with your health care provider. Document Released: 12/22/2008 Document Revised: 08/03/2015 Document Reviewed: 04/01/2014 Elsevier  Interactive Patient Education  2017 [employer].

## 2022-05-02 ENCOUNTER — Other Ambulatory Visit: Payer: Self-pay

## 2022-05-02 ENCOUNTER — Telehealth: Payer: Self-pay | Admitting: Family Medicine

## 2022-05-02 MED ORDER — ACCU-CHEK GUIDE W/DEVICE KIT: PACK | 0 refills | Status: DC

## 2022-05-02 NOTE — Telephone Encounter (Signed)
Yes, ok to send

## 2022-05-26 ENCOUNTER — Other Ambulatory Visit: Payer: Self-pay | Admitting: Family Medicine

## 2022-05-26 DIAGNOSIS — E119 Type 2 diabetes mellitus without complications: Secondary | ICD-10-CM

## 2022-05-26 DIAGNOSIS — I152 Hypertension secondary to endocrine disorders: Secondary | ICD-10-CM

## 2022-05-26 DIAGNOSIS — E1169 Type 2 diabetes mellitus with other specified complication: Secondary | ICD-10-CM

## 2022-05-29 ENCOUNTER — Ambulatory Visit: Payer: Medicare Other | Admitting: Family Medicine

## 2022-06-19 DIAGNOSIS — M62830 Muscle spasm of back: Secondary | ICD-10-CM | POA: Diagnosis not present

## 2022-06-19 DIAGNOSIS — R309 Painful micturition, unspecified: Secondary | ICD-10-CM | POA: Diagnosis not present

## 2022-06-24 ENCOUNTER — Other Ambulatory Visit: Payer: Self-pay | Admitting: Family Medicine

## 2022-06-24 DIAGNOSIS — E1159 Type 2 diabetes mellitus with other circulatory complications: Secondary | ICD-10-CM

## 2022-06-24 DIAGNOSIS — E1169 Type 2 diabetes mellitus with other specified complication: Secondary | ICD-10-CM

## 2022-06-24 DIAGNOSIS — E119 Type 2 diabetes mellitus without complications: Secondary | ICD-10-CM

## 2022-06-26 ENCOUNTER — Encounter: Payer: Self-pay | Admitting: Family Medicine

## 2022-06-26 ENCOUNTER — Ambulatory Visit (INDEPENDENT_AMBULATORY_CARE_PROVIDER_SITE_OTHER): Payer: 59 | Admitting: Family Medicine

## 2022-06-26 VITALS — BP 112/61 | HR 84 | Temp 98.1°F | Ht 66.0 in | Wt 184.2 lb

## 2022-06-26 DIAGNOSIS — E785 Hyperlipidemia, unspecified: Secondary | ICD-10-CM

## 2022-06-26 DIAGNOSIS — Z7984 Long term (current) use of oral hypoglycemic drugs: Secondary | ICD-10-CM

## 2022-06-26 DIAGNOSIS — K219 Gastro-esophageal reflux disease without esophagitis: Secondary | ICD-10-CM

## 2022-06-26 DIAGNOSIS — E1159 Type 2 diabetes mellitus with other circulatory complications: Secondary | ICD-10-CM | POA: Diagnosis not present

## 2022-06-26 DIAGNOSIS — E1140 Type 2 diabetes mellitus with diabetic neuropathy, unspecified: Secondary | ICD-10-CM

## 2022-06-26 DIAGNOSIS — J4520 Mild intermittent asthma, uncomplicated: Secondary | ICD-10-CM

## 2022-06-26 DIAGNOSIS — I152 Hypertension secondary to endocrine disorders: Secondary | ICD-10-CM

## 2022-06-26 DIAGNOSIS — E1169 Type 2 diabetes mellitus with other specified complication: Secondary | ICD-10-CM

## 2022-06-26 LAB — BAYER DCA HB A1C WAIVED: HB A1C (BAYER DCA - WAIVED): 7.1 % — ABNORMAL HIGH (ref 4.8–5.6)

## 2022-06-26 NOTE — Progress Notes (Signed)
Established Patient Office Visit  Subjective   Patient ID: Anthony Swanson, male    DOB: 04-Jul-1950  Age: 72 y.o. MRN: [id number]  Chief Complaint  Patient presents with   Medical Management of Chronic Issues   Diabetes   Hypertension   Hyperlipidemia    HPI DM Pt presents for follow up of evaluation of Type 2 diabetes mellitus.  Current symptoms include neuropathy of feed. Patient denies foot ulcerations, hypoglycemia , increased appetite, nausea, polydipsia, polyuria, visual disturbances, vomiting, and weight loss.  Current diabetic medications include jardiance 25 mg, rybelsus 14 mg, glipizide 10 mg BID Compliant with meds - Yes  Current monitoring regimen: home blood tests - 1 times daily Home blood sugar records: fasting range: 110s Any episodes of hypoglycemia? no  Urine microalbumin UTD? No Is Anthony Swanson on ACE inhibitor or angiotensin II receptor blocker?  Yes, losartan Is Anthony Swanson on statin? Yes simvastatin  Anthony Swanson reports compliance with a low carb diet.   2. HTN Complaint with meds - Yes Current Medications - HCTZ 12.5, losartan 50 mg Pertinent ROS:  Headache - No Fatigue - No Visual Disturbances - No Chest pain - No Dyspnea - No Palpitations - No LE edema - No   3. HLD On a statin. Anthony Swanson does walk some  4. GERD Compliant with medications - prilosec, pepcid  Cough - No Sore throat - No Voice change - No Hemoptysis - No Dysphagia or dyspepsia - No Water brash - No Red Flags (weight loss, hematochezia, melena, weight loss, early satiety, fevers, odynophagia, or persistent vomiting) - No  5. Asthma Reports rarely uses albuterol. Denies wheezing, cough, shortness of breath.   Past Medical History:  Diagnosis Date   Allergy    Arthritis    Asthma    Diabetes mellitus    Hyperlipidemia    Hypertension    Myocardial infarction       ROS As per HPI.   Objective:     BP 112/61   Pulse 84   Temp 98.1 F (36.7 C) (Temporal)   Ht 5\' 6"  (1.676 m)   Wt 184 lb  4 oz (83.6 kg)   SpO2 94%   BMI 29.74 kg/m  BP Readings from Last 3 Encounters:  06/26/22 112/61  02/27/22 124/81  09/12/21 108/67   Wt Readings from Last 3 Encounters:  06/26/22 184 lb 4 oz (83.6 kg)  04/25/22 193 lb (87.5 kg)  02/27/22 193 lb (87.5 kg)      Physical Exam Vitals and nursing note reviewed.  Constitutional:      General: Anthony Swanson is not in acute distress.    Appearance: Anthony Swanson is not ill-appearing, toxic-appearing or diaphoretic.  HENT:     Mouth/Throat:     Mouth: Mucous membranes are moist.     Pharynx: Oropharynx is clear.  Cardiovascular:     Rate and Rhythm: Normal rate and regular rhythm.     Heart sounds: Normal heart sounds. No murmur heard. Pulmonary:     Effort: Pulmonary effort is normal. No respiratory distress.     Breath sounds: Normal breath sounds.  Abdominal:     General: Bowel sounds are normal. There is no distension.     Palpations: Abdomen is soft.     Tenderness: There is no abdominal tenderness. There is no guarding or rebound.  Musculoskeletal:     Right lower leg: No edema.     Left lower leg: No edema.  Skin:    General: Skin is warm and  dry.  Neurological:     General: No focal deficit present.     Mental Status: Anthony Swanson is alert and oriented to person, place, and time.  Psychiatric:        Mood and Affect: Mood normal.        Behavior: Behavior normal.     No results found for any visits on 06/26/22.  The ASCVD Risk score (Arnett DK, et al., 2019) failed to calculate for the following reasons:   The patient has a prior MI or stroke diagnosis    Assessment & Plan:   Anthony Swanson was seen today for medical management of chronic issues, diabetes, hypertension and hyperlipidemia.  Diagnoses and all orders for this visit:  Type 2 diabetes mellitus with diabetic neuropathy, without long-term current use of insulin A1c 7.1 today, not at goal of <7. Medication changes today: declined changes today. Continue jardiance, rybelsus, glipizide. Anthony Swanson  is on an ACE/ARB and statin. Eye exam: will request records. Foot exam: UTD. Urine micro: UTD. Diet and exercise.  -     Bayer DCA Hb A1c Waived  Hypertension associated with diabetes Well controlled on current regimen.   Hyperlipidemia associated with type 2 diabetes mellitus Last LDL was 60. Continue statin.   Gastroesophageal reflux disease without esophagitis Well controlled on current regimen. Can stop pepcid.   Mild intermittent asthma without complication Well controlled on current regimen. Continue albuterol prn.   Return in about 3 months (around 09/25/2022).   The patient indicates understanding of these issues and agrees with the plan.  Gabriel Earing, FNP

## 2022-07-15 ENCOUNTER — Telehealth: Payer: Self-pay | Admitting: Family Medicine

## 2022-07-15 NOTE — Telephone Encounter (Signed)
noted 

## 2022-07-21 ENCOUNTER — Other Ambulatory Visit: Payer: Self-pay | Admitting: Family Medicine

## 2022-07-21 DIAGNOSIS — E1169 Type 2 diabetes mellitus with other specified complication: Secondary | ICD-10-CM

## 2022-07-21 DIAGNOSIS — I152 Hypertension secondary to endocrine disorders: Secondary | ICD-10-CM

## 2022-07-21 DIAGNOSIS — E119 Type 2 diabetes mellitus without complications: Secondary | ICD-10-CM

## 2022-08-13 LAB — HM DIABETES EYE EXAM

## 2022-08-15 DIAGNOSIS — M79676 Pain in unspecified toe(s): Secondary | ICD-10-CM | POA: Diagnosis not present

## 2022-08-15 DIAGNOSIS — B351 Tinea unguium: Secondary | ICD-10-CM | POA: Diagnosis not present

## 2022-08-15 DIAGNOSIS — E1142 Type 2 diabetes mellitus with diabetic polyneuropathy: Secondary | ICD-10-CM | POA: Diagnosis not present

## 2022-08-15 DIAGNOSIS — L84 Corns and callosities: Secondary | ICD-10-CM | POA: Diagnosis not present

## 2022-08-17 ENCOUNTER — Other Ambulatory Visit: Payer: Self-pay | Admitting: Family Medicine

## 2022-08-17 DIAGNOSIS — E1140 Type 2 diabetes mellitus with diabetic neuropathy, unspecified: Secondary | ICD-10-CM

## 2022-08-23 ENCOUNTER — Other Ambulatory Visit: Payer: Self-pay | Admitting: Family Medicine

## 2022-08-23 DIAGNOSIS — K219 Gastro-esophageal reflux disease without esophagitis: Secondary | ICD-10-CM

## 2022-08-23 DIAGNOSIS — E1140 Type 2 diabetes mellitus with diabetic neuropathy, unspecified: Secondary | ICD-10-CM

## 2022-09-18 ENCOUNTER — Other Ambulatory Visit: Payer: Self-pay | Admitting: Family Medicine

## 2022-09-18 DIAGNOSIS — E785 Hyperlipidemia, unspecified: Secondary | ICD-10-CM

## 2022-09-18 DIAGNOSIS — E1159 Type 2 diabetes mellitus with other circulatory complications: Secondary | ICD-10-CM

## 2022-09-18 DIAGNOSIS — E119 Type 2 diabetes mellitus without complications: Secondary | ICD-10-CM

## 2022-09-26 ENCOUNTER — Encounter: Payer: Self-pay | Admitting: Family Medicine

## 2022-09-26 ENCOUNTER — Ambulatory Visit (INDEPENDENT_AMBULATORY_CARE_PROVIDER_SITE_OTHER): Payer: 59 | Admitting: Family Medicine

## 2022-09-26 VITALS — BP 110/68 | HR 77 | Temp 97.5°F | Ht 66.0 in | Wt 188.4 lb

## 2022-09-26 DIAGNOSIS — E6609 Other obesity due to excess calories: Secondary | ICD-10-CM | POA: Diagnosis not present

## 2022-09-26 DIAGNOSIS — I152 Hypertension secondary to endocrine disorders: Secondary | ICD-10-CM | POA: Diagnosis not present

## 2022-09-26 DIAGNOSIS — K219 Gastro-esophageal reflux disease without esophagitis: Secondary | ICD-10-CM | POA: Diagnosis not present

## 2022-09-26 DIAGNOSIS — I252 Old myocardial infarction: Secondary | ICD-10-CM | POA: Diagnosis not present

## 2022-09-26 DIAGNOSIS — E785 Hyperlipidemia, unspecified: Secondary | ICD-10-CM

## 2022-09-26 DIAGNOSIS — E1169 Type 2 diabetes mellitus with other specified complication: Secondary | ICD-10-CM

## 2022-09-26 DIAGNOSIS — J4520 Mild intermittent asthma, uncomplicated: Secondary | ICD-10-CM

## 2022-09-26 DIAGNOSIS — Z6830 Body mass index (BMI) 30.0-30.9, adult: Secondary | ICD-10-CM

## 2022-09-26 DIAGNOSIS — E1159 Type 2 diabetes mellitus with other circulatory complications: Secondary | ICD-10-CM | POA: Diagnosis not present

## 2022-09-26 DIAGNOSIS — E1140 Type 2 diabetes mellitus with diabetic neuropathy, unspecified: Secondary | ICD-10-CM

## 2022-09-26 LAB — CBC WITH DIFFERENTIAL/PLATELET
Basophils Absolute: 0.1 10*3/uL (ref 0.0–0.2)
Basos: 1 %
EOS (ABSOLUTE): 0.2 10*3/uL (ref 0.0–0.4)
Eos: 2 %
Hematocrit: 47.7 % (ref 37.5–51.0)
Hemoglobin: 16.3 g/dL (ref 13.0–17.7)
Immature Grans (Abs): 0 10*3/uL (ref 0.0–0.1)
Immature Granulocytes: 1 %
Lymphocytes Absolute: 1.7 10*3/uL (ref 0.7–3.1)
Lymphs: 20 %
MCH: 30.3 pg (ref 26.6–33.0)
MCHC: 34.2 g/dL (ref 31.5–35.7)
MCV: 89 fL (ref 79–97)
Monocytes Absolute: 0.6 10*3/uL (ref 0.1–0.9)
Monocytes: 7 %
Neutrophils Absolute: 6.2 10*3/uL (ref 1.4–7.0)
Neutrophils: 69 %
Platelets: 230 10*3/uL (ref 150–450)
RBC: 5.38 x10E6/uL (ref 4.14–5.80)
RDW: 13.2 % (ref 11.6–15.4)
WBC: 8.7 10*3/uL (ref 3.4–10.8)

## 2022-09-26 LAB — CMP14+EGFR
ALT: 38 IU/L (ref 0–44)
AST: 29 IU/L (ref 0–40)
Albumin: 4.6 g/dL (ref 3.8–4.8)
Alkaline Phosphatase: 56 IU/L (ref 44–121)
BUN/Creatinine Ratio: 11 (ref 10–24)
BUN: 12 mg/dL (ref 8–27)
Bilirubin Total: 0.4 mg/dL (ref 0.0–1.2)
CO2: 25 mmol/L (ref 20–29)
Calcium: 9.8 mg/dL (ref 8.6–10.2)
Chloride: 101 mmol/L (ref 96–106)
Creatinine, Ser: 1.08 mg/dL (ref 0.76–1.27)
Globulin, Total: 2.2 g/dL (ref 1.5–4.5)
Glucose: 203 mg/dL — ABNORMAL HIGH (ref 70–99)
Potassium: 4.8 mmol/L (ref 3.5–5.2)
Sodium: 139 mmol/L (ref 134–144)
Total Protein: 6.8 g/dL (ref 6.0–8.5)
eGFR: 73 mL/min/{1.73_m2} (ref >59)

## 2022-09-26 LAB — LIPID PANEL
Chol/HDL Ratio: 3 ratio (ref 0.0–5.0)
Cholesterol, Total: 140 mg/dL (ref 100–199)
HDL: 47 mg/dL (ref >39)
LDL Chol Calc (NIH): 68 mg/dL (ref 0–99)
Triglycerides: 142 mg/dL (ref 0–149)
VLDL Cholesterol Cal: 25 mg/dL (ref 5–40)

## 2022-09-26 LAB — BAYER DCA HB A1C WAIVED: HB A1C (BAYER DCA - WAIVED): 7 % — ABNORMAL HIGH (ref 4.8–5.6)

## 2022-09-26 NOTE — Progress Notes (Signed)
Established Patient Office Visit  Subjective   Patient ID: Anthony Swanson, male    DOB: 27-Feb-1951  Age: 72 y.o. MRN: [id number]  Chief Complaint  Patient presents with   Medical Management of Chronic Issues    3 month    HPI DM Pt presents for follow up of evaluation of Type 2 diabetes mellitus.  Current symptoms include neuropathy of feed. Patient denies foot ulcerations, hypoglycemia , increased appetite, nausea, polydipsia, polyuria, visual disturbances, vomiting, and weight loss.  Current diabetic medications include jardiance 25 mg, rybelsus 14 mg, glipizide 10 mg BID Compliant with meds - Yes  Current monitoring regimen: home blood tests - 1 times daily Home blood sugar records: fasting range: 110 s Any episodes of hypoglycemia? no  Urine microalbumin UTD? No Is He on ACE inhibitor or angiotensin II receptor blocker?  Yes, losartan Is He on statin? Yes simvastatin  He reports compliance with a low carb diet.   2. HTN Complaint with meds - Yes Current Medications - HCTZ 12.5, losartan 50 mg Pertinent ROS:  Headache - No Fatigue - No Visual Disturbances - No Chest pain - No Dyspnea - No Palpitations - No LE edema - No   3. HLD On a statin. Low carb diet, avoid fried, greasy foods. Walks some for exercise.   4. GERD Compliant with medications - prilosec, pepcid as needed Cough - No Sore throat - No Voice change - No Hemoptysis - No Dysphagia - No Heartburn- 2-3x a week Water brash - No Red Flags (weight loss, hematochezia, melena, weight loss, early satiety, fevers, odynophagia, or persistent vomiting) - No  5. Asthma Reports rarely uses albuterol. Denies wheezing, cough, shortness of breath.   Past Medical History:  Diagnosis Date   Allergy    Arthritis    Asthma    Diabetes mellitus    Hyperlipidemia    Hypertension    Myocardial infarction (HCC)       ROS As per HPI.   Objective:     BP 110/68   Pulse 77   Temp (!) 97.5 F (36.4  C) (Temporal)   Ht 5\' 6"  (1.676 m)   Wt 188 lb 6.4 oz (85.5 kg)   SpO2 95%   BMI 30.41 kg/m  BP Readings from Last 3 Encounters:  09/26/22 110/68  06/26/22 112/61  02/27/22 124/81   Wt Readings from Last 3 Encounters:  09/26/22 188 lb 6.4 oz (85.5 kg)  06/26/22 184 lb 4 oz (83.6 kg)  04/25/22 193 lb (87.5 kg)      Physical Exam Vitals and nursing note reviewed.  Constitutional:      General: He is not in acute distress.    Appearance: He is not ill-appearing, toxic-appearing or diaphoretic.  HENT:     Mouth/Throat:     Mouth: Mucous membranes are moist.     Pharynx: Oropharynx is clear.  Cardiovascular:     Rate and Rhythm: Normal rate and regular rhythm.     Heart sounds: Normal heart sounds. No murmur heard. Pulmonary:     Effort: Pulmonary effort is normal. No respiratory distress.     Breath sounds: Normal breath sounds.  Abdominal:     General: Bowel sounds are normal. There is no distension.     Palpations: Abdomen is soft.     Tenderness: There is no abdominal tenderness. There is no guarding or rebound.  Musculoskeletal:     Right lower leg: No edema.     Left lower leg:  No edema.  Skin:    General: Skin is warm and dry.  Neurological:     General: No focal deficit present.     Mental Status: He is alert and oriented to person, place, and time.  Psychiatric:        Mood and Affect: Mood normal.        Behavior: Behavior normal.    Diabetic Foot Exam - Simple   Simple Foot Form Diabetic Foot exam was performed with the following findings: Yes 09/26/2022 10:20 AM  Visual Inspection Sensation Testing Intact to touch and monofilament testing bilaterally: Yes Pulse Check Posterior Tibialis and Dorsalis pulse intact bilaterally: Yes Comments Bilateral calluses. No skin breakdown      The ASCVD Risk score (Arnett DK, et al., 2019) failed to calculate for the following reasons:   The patient has a prior MI or stroke diagnosis    Assessment & Plan:    Anthony Swanson was seen today for medical management of chronic issues.  Diagnoses and all orders for this visit:  Type 2 diabetes mellitus with diabetic neuropathy, without long-term current use of insulin (HCC) A1c 7.0 today, not goal of <7. Medication changes today: patient declined changes today. Continue jardiance, rybelsus, glipizide. He is on ACE/ARB and statin. Eye exam: UTD. Foot exam: UTD. Urine micro: UTD. Diet and exercise.  -     Bayer DCA Hb A1c Waived -     CMP14+EGFR -     Bayer DCA Hb A1c Waived  Hypertension associated with diabetes (HCC) Well controlled on current regimen.  -     CBC with Differential/Platelet -     CMP14+EGFR  Hyperlipidemia associated with type 2 diabetes mellitus (HCC) Lipid panel pending. On statin.  -     Lipid panel  Gastroesophageal reflux disease without esophagitis Well controlled on current regimen. .   Class 1 obesity due to excess calories with serious comorbidity and body mass index (BMI) of 30.0 to 30.9 in adult Diet and exercise. Weight is stable.   History of myocardial infarction On statin.   Mild intermittent asthma without complication Well controlled on current regimen.   Return in about 3 months (around 12/27/2022) for chronic follow up.   The patient indicates understanding of these issues and agrees with the plan.  Gabriel Earing, FNP

## 2022-10-03 ENCOUNTER — Telehealth: Payer: Self-pay | Admitting: Family Medicine

## 2022-10-03 NOTE — Telephone Encounter (Signed)
Appt scheduled for 7/29 at 9:20

## 2022-10-03 NOTE — Telephone Encounter (Signed)
NTBS. He may be having an asthma exacerbation.

## 2022-10-07 ENCOUNTER — Ambulatory Visit: Payer: 59 | Admitting: Family Medicine

## 2022-10-16 ENCOUNTER — Other Ambulatory Visit: Payer: Self-pay | Admitting: Family Medicine

## 2022-10-16 DIAGNOSIS — E119 Type 2 diabetes mellitus without complications: Secondary | ICD-10-CM

## 2022-10-16 DIAGNOSIS — E1169 Type 2 diabetes mellitus with other specified complication: Secondary | ICD-10-CM

## 2022-10-16 DIAGNOSIS — I152 Hypertension secondary to endocrine disorders: Secondary | ICD-10-CM

## 2022-11-08 ENCOUNTER — Telehealth: Payer: Self-pay | Admitting: Family Medicine

## 2022-11-08 NOTE — Telephone Encounter (Signed)
I spoke to pt and he said he had redness, swelling and itching at the site of the sting. Pt denies any SOB, swelling of face etc. Pt states he can't use benadryl so advised pt to put ice on it for 20 minutes 4-5 times a day and pt voiced understanding.

## 2022-11-12 ENCOUNTER — Other Ambulatory Visit: Payer: Self-pay | Admitting: Family Medicine

## 2022-11-12 DIAGNOSIS — E1140 Type 2 diabetes mellitus with diabetic neuropathy, unspecified: Secondary | ICD-10-CM

## 2022-11-17 ENCOUNTER — Other Ambulatory Visit: Payer: Self-pay | Admitting: Family Medicine

## 2022-11-17 DIAGNOSIS — K219 Gastro-esophageal reflux disease without esophagitis: Secondary | ICD-10-CM

## 2022-11-17 DIAGNOSIS — E1140 Type 2 diabetes mellitus with diabetic neuropathy, unspecified: Secondary | ICD-10-CM

## 2022-12-26 ENCOUNTER — Other Ambulatory Visit: Payer: Self-pay | Admitting: Family Medicine

## 2022-12-26 DIAGNOSIS — J4540 Moderate persistent asthma, uncomplicated: Secondary | ICD-10-CM

## 2022-12-27 ENCOUNTER — Ambulatory Visit: Payer: 59 | Admitting: Family Medicine

## 2022-12-27 ENCOUNTER — Encounter: Payer: Self-pay | Admitting: Family Medicine

## 2022-12-27 VITALS — BP 101/64 | HR 95 | Temp 97.8°F | Ht 66.0 in | Wt 183.4 lb

## 2022-12-27 DIAGNOSIS — I252 Old myocardial infarction: Secondary | ICD-10-CM | POA: Diagnosis not present

## 2022-12-27 DIAGNOSIS — J4520 Mild intermittent asthma, uncomplicated: Secondary | ICD-10-CM

## 2022-12-27 DIAGNOSIS — E1169 Type 2 diabetes mellitus with other specified complication: Secondary | ICD-10-CM | POA: Diagnosis not present

## 2022-12-27 DIAGNOSIS — E1140 Type 2 diabetes mellitus with diabetic neuropathy, unspecified: Secondary | ICD-10-CM | POA: Diagnosis not present

## 2022-12-27 DIAGNOSIS — I152 Hypertension secondary to endocrine disorders: Secondary | ICD-10-CM | POA: Diagnosis not present

## 2022-12-27 DIAGNOSIS — Z23 Encounter for immunization: Secondary | ICD-10-CM

## 2022-12-27 DIAGNOSIS — E6609 Other obesity due to excess calories: Secondary | ICD-10-CM

## 2022-12-27 DIAGNOSIS — K219 Gastro-esophageal reflux disease without esophagitis: Secondary | ICD-10-CM

## 2022-12-27 DIAGNOSIS — Z6830 Body mass index (BMI) 30.0-30.9, adult: Secondary | ICD-10-CM

## 2022-12-27 DIAGNOSIS — E1159 Type 2 diabetes mellitus with other circulatory complications: Secondary | ICD-10-CM | POA: Diagnosis not present

## 2022-12-27 DIAGNOSIS — E66811 Obesity, class 1: Secondary | ICD-10-CM

## 2022-12-27 DIAGNOSIS — M62838 Other muscle spasm: Secondary | ICD-10-CM

## 2022-12-27 LAB — BAYER DCA HB A1C WAIVED: HB A1C (BAYER DCA - WAIVED): 6.6 % — ABNORMAL HIGH (ref 4.8–5.6)

## 2022-12-27 MED ORDER — CYCLOBENZAPRINE HCL 5 MG PO TABS
5.0000 mg | ORAL_TABLET | Freq: Three times a day (TID) | ORAL | 0 refills | Status: DC | PRN
Start: 2022-12-27 — End: 2023-01-24

## 2022-12-27 NOTE — Progress Notes (Signed)
Established Patient Office Visit  Subjective   Patient ID: Anthony Swanson, male    DOB: 03/04/1951  Age: 72 y.o. MRN: [id number]  Chief Complaint  Patient presents with   Medical Management of Chronic Issues   Diabetes    HPI DM Pt presents for follow up of evaluation of Type 2 diabetes mellitus.  Current symptoms include neuropathy of feed. Patient denies foot ulcerations, hypoglycemia , increased appetite, nausea, polydipsia, polyuria, visual disturbances, vomiting, and weight loss.  Current diabetic medications include jardiance 25 mg, rybelsus 14 mg, glipizide 10 mg BID Compliant with meds - Yes  Current monitoring regimen: home blood tests - 1 times daily Home blood sugar records: fasting range: 100-110s Any episodes of hypoglycemia? no  Urine microalbumin UTD? No Is He on ACE inhibitor or angiotensin II receptor blocker?  Yes, losartan Is He on statin? Yes simvastatin  He reports compliance with a low carb diet.   2. HTN Complaint with meds - Yes Current Medications - HCTZ 12.5, losartan 50 mg Pertinent ROS:  Headache - No Fatigue - No Visual Disturbances - No Chest pain - No Dyspnea - No Palpitations - No LE edema - No   3. HLD On a statin. Low carb diet, avoid fried, greasy foods. Walks daily for 1 hour every day.   4. GERD Compliant with medications - prilosec, pepcid as needed Cough - No Sore throat - No Voice change - No Hemoptysis - No Dysphagia - No Heartburn- 2-3x a week Water brash - No Red Flags (weight loss, hematochezia, melena, weight loss, early satiety, fevers, odynophagia, or persistent vomiting) - No  5. Asthma Reports uses albuterol 1-2x a week. Denies wheezing, cough, shortness of breath.   6. Muscle spasms Would like refill of flexeril. 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 Denies drowsiness or side effects.   Past Medical History:  Diagnosis Date   Allergy    Arthritis    Asthma    Diabetes mellitus     Hyperlipidemia    Hypertension    Myocardial infarction (HCC)       ROS As per HPI.   Objective:     BP 101/64   Pulse 95   Temp 97.8 F (36.6 C) (Temporal)   Ht 5\' 6"  (1.676 m)   Wt 183 lb 6 oz (83.2 kg)   SpO2 93%   BMI 29.60 kg/m  BP Readings from Last 3 Encounters:  12/27/22 101/64  09/26/22 110/68  06/26/22 112/61   Wt Readings from Last 3 Encounters:  12/27/22 183 lb 6 oz (83.2 kg)  09/26/22 188 lb 6.4 oz (85.5 kg)  06/26/22 184 lb 4 oz (83.6 kg)      Physical Exam Vitals and nursing note reviewed.  Constitutional:      General: He is not in acute distress.    Appearance: He is not ill-appearing, toxic-appearing or diaphoretic.  HENT:     Mouth/Throat:     Mouth: Mucous membranes are moist.     Pharynx: Oropharynx is clear.  Cardiovascular:     Rate and Rhythm: Normal rate and regular rhythm.     Heart sounds: Normal heart sounds. No murmur heard. Pulmonary:     Effort: Pulmonary effort is normal. No respiratory distress.     Breath sounds: Normal breath sounds.  Abdominal:     General: Bowel sounds are normal. There is no distension.     Palpations: Abdomen is soft.     Tenderness:  There is no abdominal tenderness. There is no guarding or rebound.  Musculoskeletal:     Right lower leg: No edema.     Left lower leg: No edema.  Skin:    General: Skin is warm and dry.  Neurological:     General: No focal deficit present.     Mental Status: He is alert and oriented to person, place, and time.  Psychiatric:        Mood and Affect: Mood normal.        Behavior: Behavior normal.    The ASCVD Risk score (Arnett DK, et al., 2019) failed to calculate for the following reasons:   The patient has a prior MI or stroke diagnosis    Assessment & Plan:   Claud was seen today for medical management of chronic issues and diabetes.  Diagnoses and all orders for this visit:  Type 2 diabetes mellitus with diabetic neuropathy, without long-term current use  of insulin (HCC) A1c 6.6 today, at goal of <7. Medication changes today: continue rybelsus, jardiance, glipizide. He on is an ACE/ARB and statin. Eye exam: UTD. Foot exam: UTD. Urine micro: UTD. Diet and exercise.  -     Microalbumin / creatinine urine ratio -     Bayer DCA Hb A1c Waived  Hypertension associated with diabetes (HCC) Well controlled on current regimen.   Hyperlipidemia associated with type 2 diabetes mellitus (HCC) Last LDL 68. On statin.  Gastroesophageal reflux disease without esophagitis Well controlled on current regimen.   Class 1 obesity due to excess calories with serious comorbidity and body mass index (BMI) of 30.0 to 30.9 in adult Weight trending down.   History of myocardial infarction On statin and aspirin.   Mild intermittent asthma without complication Well controlled on current regimen.   Muscle spasm Refill provided.  -     cyclobenzaprine (FLEXERIL) 5 MG tablet; Take 1 tablet (5 mg total) by mouth 3 (three) times daily as needed for muscle spasms.  Need for vaccination -     Pneumococcal conjugate vaccine 20-valent (Prevnar 20)  Encounter for immunization -     Flu Vaccine Trivalent High Dose (Fluad)    Return in about 3 months (around 03/29/2023) for chronic follow up.   The patient indicates understanding of these issues and agrees with the plan.  Gabriel Earing, FNP

## 2022-12-31 LAB — MICROALBUMIN / CREATININE URINE RATIO: Creatinine, Urine: 19.9 mg/dL

## 2023-01-14 ENCOUNTER — Other Ambulatory Visit: Payer: Self-pay | Admitting: Family Medicine

## 2023-01-14 DIAGNOSIS — E1169 Type 2 diabetes mellitus with other specified complication: Secondary | ICD-10-CM

## 2023-01-14 DIAGNOSIS — E1159 Type 2 diabetes mellitus with other circulatory complications: Secondary | ICD-10-CM

## 2023-01-14 DIAGNOSIS — E119 Type 2 diabetes mellitus without complications: Secondary | ICD-10-CM

## 2023-01-23 ENCOUNTER — Other Ambulatory Visit: Payer: Self-pay | Admitting: Family Medicine

## 2023-01-23 DIAGNOSIS — M62838 Other muscle spasm: Secondary | ICD-10-CM

## 2023-01-24 NOTE — Telephone Encounter (Signed)
Last office visit 12/27/22 Last refill 12/27/22, #30, no refills

## 2023-02-13 DIAGNOSIS — H905 Unspecified sensorineural hearing loss: Secondary | ICD-10-CM | POA: Diagnosis not present

## 2023-02-16 ENCOUNTER — Other Ambulatory Visit: Payer: Self-pay | Admitting: Family Medicine

## 2023-02-16 DIAGNOSIS — E1140 Type 2 diabetes mellitus with diabetic neuropathy, unspecified: Secondary | ICD-10-CM

## 2023-02-16 DIAGNOSIS — K219 Gastro-esophageal reflux disease without esophagitis: Secondary | ICD-10-CM

## 2023-02-20 ENCOUNTER — Other Ambulatory Visit: Payer: Self-pay | Admitting: Family Medicine

## 2023-02-20 DIAGNOSIS — M62838 Other muscle spasm: Secondary | ICD-10-CM

## 2023-03-11 ENCOUNTER — Other Ambulatory Visit: Payer: Self-pay | Admitting: Family Medicine

## 2023-03-11 DIAGNOSIS — J4540 Moderate persistent asthma, uncomplicated: Secondary | ICD-10-CM

## 2023-03-14 DIAGNOSIS — R059 Cough, unspecified: Secondary | ICD-10-CM | POA: Diagnosis not present

## 2023-03-14 DIAGNOSIS — R0781 Pleurodynia: Secondary | ICD-10-CM | POA: Diagnosis not present

## 2023-03-19 ENCOUNTER — Ambulatory Visit: Payer: 59 | Admitting: Family Medicine

## 2023-03-25 ENCOUNTER — Other Ambulatory Visit: Payer: Self-pay | Admitting: Family Medicine

## 2023-03-25 DIAGNOSIS — M62838 Other muscle spasm: Secondary | ICD-10-CM

## 2023-03-31 ENCOUNTER — Encounter: Payer: Self-pay | Admitting: Family Medicine

## 2023-03-31 ENCOUNTER — Ambulatory Visit (INDEPENDENT_AMBULATORY_CARE_PROVIDER_SITE_OTHER): Payer: 59 | Admitting: Family Medicine

## 2023-03-31 VITALS — BP 138/78 | HR 88 | Temp 97.6°F | Ht 66.0 in | Wt 187.0 lb

## 2023-03-31 DIAGNOSIS — M545 Low back pain, unspecified: Secondary | ICD-10-CM

## 2023-03-31 DIAGNOSIS — K219 Gastro-esophageal reflux disease without esophagitis: Secondary | ICD-10-CM

## 2023-03-31 DIAGNOSIS — Z7984 Long term (current) use of oral hypoglycemic drugs: Secondary | ICD-10-CM | POA: Diagnosis not present

## 2023-03-31 DIAGNOSIS — E785 Hyperlipidemia, unspecified: Secondary | ICD-10-CM

## 2023-03-31 DIAGNOSIS — E1140 Type 2 diabetes mellitus with diabetic neuropathy, unspecified: Secondary | ICD-10-CM | POA: Diagnosis not present

## 2023-03-31 DIAGNOSIS — J4520 Mild intermittent asthma, uncomplicated: Secondary | ICD-10-CM | POA: Diagnosis not present

## 2023-03-31 DIAGNOSIS — G8929 Other chronic pain: Secondary | ICD-10-CM | POA: Diagnosis not present

## 2023-03-31 DIAGNOSIS — E1169 Type 2 diabetes mellitus with other specified complication: Secondary | ICD-10-CM

## 2023-03-31 DIAGNOSIS — I152 Hypertension secondary to endocrine disorders: Secondary | ICD-10-CM

## 2023-03-31 DIAGNOSIS — E66811 Obesity, class 1: Secondary | ICD-10-CM

## 2023-03-31 DIAGNOSIS — E1159 Type 2 diabetes mellitus with other circulatory complications: Secondary | ICD-10-CM | POA: Diagnosis not present

## 2023-03-31 DIAGNOSIS — Z6830 Body mass index (BMI) 30.0-30.9, adult: Secondary | ICD-10-CM

## 2023-03-31 LAB — BAYER DCA HB A1C WAIVED: HB A1C (BAYER DCA - WAIVED): 6.8 % — ABNORMAL HIGH (ref 4.8–5.6)

## 2023-03-31 MED ORDER — SIMVASTATIN 20 MG PO TABS: 20.0000 mg | ORAL_TABLET | Freq: Every morning | ORAL | 2 refills | Status: DC

## 2023-03-31 MED ORDER — LOSARTAN POTASSIUM 50 MG PO TABS: 50.0000 mg | ORAL_TABLET | Freq: Every day | ORAL | 2 refills | Status: DC

## 2023-03-31 MED ORDER — GLIPIZIDE 10 MG PO TABS: 10.0000 mg | ORAL_TABLET | Freq: Two times a day (BID) | ORAL | 2 refills | Status: DC

## 2023-03-31 NOTE — Assessment & Plan Note (Signed)
Well controlled 

## 2023-03-31 NOTE — Assessment & Plan Note (Signed)
A1c 6.8 today, at goal of <7. Continue jardiance, glipizide, rybelsus. He is on an ACE/ARB and statin. Diet and exercise.

## 2023-03-31 NOTE — Addendum Note (Signed)
Addended by: Gabriel Earing on: 03/31/2023 01:44 PM   Modules accepted: Level of Service

## 2023-03-31 NOTE — Progress Notes (Signed)
Established Patient Office Visit  Subjective   Patient ID: Anthony Swanson, male    DOB: [birth year]  Age: 73 y.o. MRN: [id number]  Chief Complaint  Patient presents with   Medical Management of Chronic Issues    HPI DM Pt presents for follow up of evaluation of Type 2 diabetes mellitus.  Current symptoms include neuropathy of feed. Patient denies foot ulcerations, hypoglycemia , increased appetite, nausea, polydipsia, polyuria, visual disturbances, vomiting, and weight loss.  Current diabetic medications include jardiance 25 mg, rybelsus 14 mg, glipizide 10 mg BID Compliant with meds - Yes  Current monitoring regimen: none Any episodes of hypoglycemia? no  Urine microalbumin UTD? No Is He on ACE inhibitor or angiotensin II receptor blocker?  Yes, losartan Is He on statin? Yes simvastatin  He reports compliance with a low carb diet.   2. HTN Complaint with meds - Yes Current Medications - HCTZ 12.5, losartan 50 mg Pertinent ROS:  Headache - No Fatigue - No Visual Disturbances - No Chest pain - No Dyspnea - No Palpitations - No LE edema - No   3. HLD On a statin. Low carb diet, avoid fried, greasy foods. Walks daily for 1 hour every day.   4. GERD Compliant with medications - prilosec, pepcid as needed. Cough - No Sore throat - No Voice change - No Hemoptysis - No Dysphagia - No Heartburn- No Water brash - No Red Flags (weight loss, hematochezia, melena, weight loss, early satiety, fevers, odynophagia, or persistent vomiting) - No  5. Asthma Reports uses albuterol 2x a week. Denies wheezing, cough, shortness of breath.   6. 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 Denies drowsiness or side effects from flexeril. Tries to take this sparingly.   Past Medical History:  Diagnosis Date   Allergy    Arthritis    Asthma    Diabetes mellitus    Hyperlipidemia    Hypertension    Myocardial infarction (HCC)       ROS As per  HPI.   Objective:     BP 138/78   Pulse 88   Temp 97.6 F (36.4 C)   Ht 5\' 6"  (1.676 m)   Wt 187 lb (84.8 kg)   SpO2 98%   BMI 30.18 kg/m  BP Readings from Last 3 Encounters:  03/31/23 138/78  12/27/22 101/64  09/26/22 110/68   Wt Readings from Last 3 Encounters:  03/31/23 187 lb (84.8 kg)  12/27/22 183 lb 6 oz (83.2 kg)  09/26/22 188 lb 6.4 oz (85.5 kg)      Physical Exam Vitals and nursing note reviewed.  Constitutional:      General: He is not in acute distress.    Appearance: He is not ill-appearing, toxic-appearing or diaphoretic.  HENT:     Mouth/Throat:     Mouth: Mucous membranes are moist.     Pharynx: Oropharynx is clear.  Cardiovascular:     Rate and Rhythm: Normal rate and regular rhythm.     Heart sounds: Normal heart sounds. No murmur heard. Pulmonary:     Effort: Pulmonary effort is normal. No respiratory distress.     Breath sounds: Normal breath sounds.  Abdominal:     General: Bowel sounds are normal. There is no distension.     Palpations: Abdomen is soft.     Tenderness: There is no abdominal tenderness. There is no guarding or rebound.  Musculoskeletal:     Right lower leg: No  edema.     Left lower leg: No edema.  Skin:    General: Skin is warm and dry.  Neurological:     General: No focal deficit present.     Mental Status: He is alert and oriented to person, place, and time.  Psychiatric:        Mood and Affect: Mood normal.        Behavior: Behavior normal.    The ASCVD Risk score (Arnett DK, et al., 2019) failed to calculate for the following reasons:   Risk score cannot be calculated because patient has a medical history suggesting prior/existing ASCVD    Assessment & Plan:    Type 2 diabetes mellitus with diabetic neuropathy, without long-term current use of insulin (HCC) Assessment & Plan: A1c 6.8 today, at goal of <7. Continue jardiance, glipizide, rybelsus. He is on an ACE/ARB and statin. Diet and exercise.     Orders: -     Bayer DCA Hb A1c Waived -     glipiZIDE; Take 1 tablet (10 mg total) by mouth 2 (two) times daily.  Dispense: 60 tablet; Refill: 2  Hypertension associated with diabetes (HCC) Assessment & Plan: Well controlled with hydrochlorothiazide, losartan.  Orders: -     CBC with Differential/Platelet -     CMP14+EGFR -     Losartan Potassium; Take 1 tablet (50 mg total) by mouth daily.  Dispense: 30 tablet; Refill: 2  Hyperlipidemia associated with type 2 diabetes mellitus (HCC) Assessment & Plan: On statin. Last LDL 68.  Orders: -     Lipid panel -     Simvastatin; Take 1 tablet (20 mg total) by mouth every morning.  Dispense: 30 tablet; Refill: 2  Mild intermittent asthma without complication Assessment & Plan: Well controlled.    Chronic midline low back pain without sciatica Assessment & Plan: Continue flexeril prn. No red flag symptoms.    Class 1 obesity due to excess calories with serious comorbidity and body mass index (BMI) of 30.0 to 30.9 in adult Assessment & Plan: Diet, exercise, weight loss.    Gastroesophageal reflux disease without esophagitis Assessment & Plan: Well controlled on current regimen.      Return in about 3 months (around 06/29/2023) for CPE.   The patient indicates understanding of these issues and agrees with the plan.  Gabriel Earing, FNP

## 2023-03-31 NOTE — Assessment & Plan Note (Signed)
Well-controlled with hydrochlorothiazide, losartan

## 2023-03-31 NOTE — Assessment & Plan Note (Signed)
On statin. Last LDL 68.

## 2023-03-31 NOTE — Assessment & Plan Note (Signed)
Continue flexeril prn. No red flag symptoms.

## 2023-03-31 NOTE — Assessment & Plan Note (Signed)
Diet, exercise, weight loss

## 2023-03-31 NOTE — Assessment & Plan Note (Signed)
Well-controlled on current regimen. ?

## 2023-04-01 LAB — CMP14+EGFR
ALT: 20 [IU]/L (ref 0–44)
AST: 15 [IU]/L (ref 0–40)
Albumin: 4.6 g/dL (ref 3.8–4.8)
Alkaline Phosphatase: 60 [IU]/L (ref 44–121)
BUN/Creatinine Ratio: 13 (ref 10–24)
BUN: 14 mg/dL (ref 8–27)
Bilirubin Total: 0.3 mg/dL (ref 0.0–1.2)
CO2: 24 mmol/L (ref 20–29)
Calcium: 9.6 mg/dL (ref 8.6–10.2)
Chloride: 99 mmol/L (ref 96–106)
Creatinine, Ser: 1.06 mg/dL (ref 0.76–1.27)
Globulin, Total: 2.3 g/dL (ref 1.5–4.5)
Glucose: 177 mg/dL — ABNORMAL HIGH (ref 70–99)
Potassium: 4.3 mmol/L (ref 3.5–5.2)
Sodium: 136 mmol/L (ref 134–144)
Total Protein: 6.9 g/dL (ref 6.0–8.5)
eGFR: 74 mL/min/{1.73_m2} (ref >59)

## 2023-04-01 LAB — LIPID PANEL
Chol/HDL Ratio: 2.9 {ratio} (ref 0.0–5.0)
Cholesterol, Total: 138 mg/dL (ref 100–199)
HDL: 48 mg/dL (ref >39)
LDL Chol Calc (NIH): 64 mg/dL (ref 0–99)
Triglycerides: 155 mg/dL — ABNORMAL HIGH (ref 0–149)
VLDL Cholesterol Cal: 26 mg/dL (ref 5–40)

## 2023-04-01 LAB — CBC WITH DIFFERENTIAL/PLATELET
Basophils Absolute: 0 10*3/uL (ref 0.0–0.2)
Basos: 0 %
EOS (ABSOLUTE): 0.2 10*3/uL (ref 0.0–0.4)
Eos: 2 %
Hematocrit: 48.7 % (ref 37.5–51.0)
Hemoglobin: 16.2 g/dL (ref 13.0–17.7)
Immature Grans (Abs): 0 10*3/uL (ref 0.0–0.1)
Immature Granulocytes: 1 %
Lymphocytes Absolute: 1.6 10*3/uL (ref 0.7–3.1)
Lymphs: 20 %
MCH: 29.6 pg (ref 26.6–33.0)
MCHC: 33.3 g/dL (ref 31.5–35.7)
MCV: 89 fL (ref 79–97)
Monocytes Absolute: 0.5 10*3/uL (ref 0.1–0.9)
Monocytes: 6 %
Neutrophils Absolute: 5.8 10*3/uL (ref 1.4–7.0)
Neutrophils: 71 %
Platelets: 257 10*3/uL (ref 150–450)
RBC: 5.47 x10E6/uL (ref 4.14–5.80)
RDW: 12.8 % (ref 11.6–15.4)
WBC: 8.1 10*3/uL (ref 3.4–10.8)

## 2023-04-10 ENCOUNTER — Other Ambulatory Visit: Payer: Self-pay | Admitting: Family Medicine

## 2023-04-10 DIAGNOSIS — J4540 Moderate persistent asthma, uncomplicated: Secondary | ICD-10-CM

## 2023-04-12 ENCOUNTER — Other Ambulatory Visit: Payer: Self-pay | Admitting: Family Medicine

## 2023-04-12 DIAGNOSIS — M62838 Other muscle spasm: Secondary | ICD-10-CM

## 2023-04-15 ENCOUNTER — Other Ambulatory Visit: Payer: Self-pay | Admitting: Family Medicine

## 2023-04-15 DIAGNOSIS — E1159 Type 2 diabetes mellitus with other circulatory complications: Secondary | ICD-10-CM

## 2023-05-01 ENCOUNTER — Telehealth: Payer: Self-pay

## 2023-05-01 NOTE — Telephone Encounter (Signed)
 Appt scheduled for 05/08/23, per pt request appt had to be for next week, offered appt on 02/21 Pt declined said he was moving and can not come in

## 2023-05-01 NOTE — Telephone Encounter (Signed)
 Copied from CRM [id number]. Topic: Clinical - Medical Advice >> May 01, 2023  2:11 PM Anthony Swanson wrote: Reason for CRM: Pt is requesting an antibiotic for his sinus infection [phone]

## 2023-05-02 ENCOUNTER — Ambulatory Visit: Payer: 59

## 2023-05-02 VITALS — Ht 66.0 in | Wt 187.0 lb

## 2023-05-02 DIAGNOSIS — Z0000 Encounter for general adult medical examination without abnormal findings: Secondary | ICD-10-CM | POA: Diagnosis not present

## 2023-05-02 NOTE — Patient Instructions (Signed)
 Mr. Summer , Thank you for taking time to come for your Medicare Wellness Visit. I appreciate your ongoing commitment to your health goals. Please review the following plan we discussed and let me know if I can assist you in the future.   Referrals/Orders/Follow-Ups/Clinician Recommendations: Aim for 30 minutes of exercise or brisk walking, 6-8 glasses of water, and 5 servings of fruits and vegetables each day.  This is a list of the screening recommended for you and due dates:  Health Maintenance  Topic Date Due   Hepatitis C Screening  09/26/2023*   COVID-19 Vaccine (3 - 2024-25 season) 01/12/2024*   Eye exam for diabetics  08/13/2023   Complete foot exam   09/26/2023   Hemoglobin A1C  09/28/2023   Yearly kidney health urinalysis for diabetes  12/27/2023   Yearly kidney function blood test for diabetes  03/30/2024   Medicare Annual Wellness Visit  05/01/2024   DTaP/Tdap/Td vaccine (5 - Td or Tdap) 03/11/2031   Pneumonia Vaccine  Completed   Flu Shot  Completed   Zoster (Shingles) Vaccine  Completed   HPV Vaccine  Aged Out   Colon Cancer Screening  Discontinued  *Topic was postponed. The date shown is not the original due date.    Advanced directives: (ACP Link)Information on Advanced Care Planning can be found at [facility] [place] Advance Health Care Directives Advance Health Care Directives ([email or web address]   Next Medicare Annual Wellness Visit scheduled for next year: Yes

## 2023-05-02 NOTE — Progress Notes (Signed)
 Subjective:   Anthony Swanson is a 73 y.o. who presents for a Medicare Wellness preventive visit.  Visit Complete: Virtual I connected with  Anthony Swanson on 05/02/23 by a audio enabled telemedicine application and verified that I am speaking with the correct person using two identifiers.  Patient Location: Home  Provider Location: Home Office  I discussed the limitations of evaluation and management by telemedicine. The patient expressed understanding and agreed to proceed.  Vital Signs: Because this visit was a virtual/telehealth visit, some criteria may be missing or patient reported. Any vitals not documented were not able to be obtained and vitals that have been documented are patient reported.  VideoDeclined- This patient declined Librarian, academic. Therefore the visit was completed with audio only.  AWV Questionnaire: No: Patient Medicare AWV questionnaire was not completed prior to this visit.  Cardiac Risk Factors include: advanced age (>56men, >62 women);male gender;hypertension;diabetes mellitus     Objective:    Today's Vitals   05/02/23 1452  Weight: 187 lb (84.8 kg)  Height: 5\' 6"  (1.676 m)   Body mass index is 30.18 kg/m.     05/02/2023    4:45 PM 04/25/2022   11:18 AM 04/24/2021   12:45 PM  Advanced Directives  Does Patient Have a Medical Advance Directive? No No No  Would patient like information on creating a medical advance directive? Yes (MAU/Ambulatory/Procedural Areas - Information given) No - Patient declined No - Patient declined    Current Medications (verified) Outpatient Encounter Medications as of 05/02/2023  Medication Sig   ACCU-CHEK GUIDE test strip Test BS daily Dx E11.9   Accu-Chek Softclix Lancets lancets USE TO TEST BLOOD SUGAR DAILY Dx E11.40   acetaminophen (TYLENOL) 500 MG tablet Take 500 mg by mouth every 6 (six) hours as needed. For pain   albuterol (VENTOLIN HFA) 108 (90 Base) MCG/ACT inhaler INHALE 2  PUFFS BY MOUTH EVERY 6 HOURS AS NEEDED SHORTNESS OF BREATH   Albuterol Sulfate 2.5 MG/0.5ML NEBU dilute ONE vial WITH 2.73ml of Sodium Chloride in NEBULIZER FOUR TIMES DAILY AS NEEDED   Blood Glucose Monitoring Suppl (ACCU-CHEK GUIDE) w/Device KIT Test BS daily Dx E11.9   cyclobenzaprine (FLEXERIL) 5 MG tablet TAKE 1 TABLET BY MOUTH THREE TIMES DAILY AS NEEDED FOR MUSCLE SPASMS   famotidine (PEPCID) 20 MG tablet TAKE 1 TABLET BY MOUTH EVERY DAY AFTER DINNER   glipiZIDE (GLUCOTROL) 10 MG tablet Take 1 tablet (10 mg total) by mouth 2 (two) times daily.   hydrochlorothiazide (HYDRODIURIL) 12.5 MG tablet TAKE 1 TABLET BY MOUTH DAILY   JARDIANCE 25 MG TABS tablet TAKE 1 TABLET BY MOUTH EVERY DAY BEFORE BREAKFAST   lidocaine (XYLOCAINE) 5 % ointment apply ONE application topically AS NEEDED   losartan (COZAAR) 50 MG tablet Take 1 tablet (50 mg total) by mouth daily.   omeprazole (PRILOSEC) 40 MG capsule TAKE ONE CAPSULE BY MOUTH EVERY MORNING   RYBELSUS 14 MG TABS TAKE 1 TABLET BY MOUTH DAILY   simvastatin (ZOCOR) 20 MG tablet Take 1 tablet (20 mg total) by mouth every morning.   No facility-administered encounter medications on file as of 05/02/2023.    Allergies (verified) Codeine, Penicillins, Percocet [oxycodone-acetaminophen], and Sulfa antibiotics   History: Past Medical History:  Diagnosis Date   Allergy    Arthritis    Asthma    Diabetes mellitus    Hyperlipidemia    Hypertension    Myocardial infarction [facility])    Past Surgical History:  Procedure Laterality Date   APPENDECTOMY  2020   Family History  Problem Relation Age of Onset   Heart attack Mother    Stroke Father    Cancer Brother    COPD Brother    Social History   Socioeconomic History   Marital status: Widowed    Spouse name: Not on file   Number of children: 4   Years of education: 8   Highest education level: 8th grade  Occupational History   Occupation: retired  Tobacco Use   Smoking status: Former     Current packs/day: 0.00    Average packs/day: 1 pack/day for 6.0 years (6.0 ttl pk-yrs)    Types: Cigarettes    Start date: 70    Quit date: 1977    Years since quitting: 48.1   Smokeless tobacco: Current    Types: Chew  Vaping Use   Vaping status: Never Used  Substance and Sexual Activity   Alcohol use: No   Drug use: No   Sexual activity: Not Currently    Birth control/protection: None  Other Topics Concern   Not on file  Social History Narrative   Lives alone - no family around here   Goes to church   4 children - they don't visit much   Social Drivers of Health   Financial Resource Strain: Low Risk  (05/02/2023)   Overall Financial Resource Strain (CARDIA)    Difficulty of Paying Living Expenses: Not hard at all  Food Insecurity: No Food Insecurity (05/02/2023)   Hunger Vital Sign    Worried About Running Out of Food in the Last Year: Never true    Ran Out of Food in the Last Year: Never true  Transportation Needs: No Transportation Needs (05/02/2023)   PRAPARE - Administrator, Civil Service (Medical): No    Lack of Transportation (Non-Medical): No  Physical Activity: Inactive (05/02/2023)   Exercise Vital Sign    Days of Exercise per Week: 0 days    Minutes of Exercise per Session: 0 min  Stress: No Stress Concern Present (05/02/2023)   [employer] of Occupational Health - Occupational Stress Questionnaire    Feeling of Stress : Not at all  Social Connections: Socially Isolated (05/02/2023)   Social Connection and Isolation Panel [NHANES]    Frequency of Communication with Friends and Family: More than three times a week    Frequency of Social Gatherings with Friends and Family: Three times a week    Attends Religious Services: Never    Active Member of Clubs or Organizations: No    Attends Banker Meetings: Never    Marital Status: Widowed    Tobacco Counseling Ready to quit: Not Answered Counseling given: Not  Answered    Clinical Intake:  Pre-visit preparation completed: Yes  Pain : No/denies pain     Diabetes: Yes CBG done?: No Did pt. bring in CBG monitor from home?: No  How often do you need to have someone help you when you read instructions, pamphlets, or other written materials from your doctor or pharmacy?: 1 - Never  Interpreter Needed?: No  Information entered by :: Kandis Fantasia LPN   Activities of Daily Living     05/02/2023    2:58 PM  In your present state of health, do you have any difficulty performing the following activities:  Hearing? 0  Vision? 0  Difficulty concentrating or making decisions? 0  Walking or climbing stairs? 0  Dressing or bathing?  0  Doing errands, shopping? 0  Preparing Food and eating ? N  Using the Toilet? N  In the past six months, have you accidently leaked urine? N  Do you have problems with loss of bowel control? N  Managing your Medications? N  Managing your Finances? N  Housekeeping or managing your Housekeeping? N    Patient Care Team: Gabriel Earing, FNP as PCP - General (Family Medicine) Delora Fuel, OD (Optometry) Cresenciano Genre Lilla Shook, [facility] as Pharmacist (Family Medicine)  Indicate any recent Medical Services you may have received from other than [facility] providers in the past year (date may be approximate).     Assessment:   This is a routine wellness examination for Enrico.  Hearing/Vision screen Hearing Screening - Comments:: Denies hearing difficulties   Vision Screening - Comments:: Wears rx glasses - up to date with routine eye exams with [facility] [place]     Goals Addressed   None    Depression Screen     05/02/2023    4:44 PM 03/31/2023   10:22 AM 09/26/2022    9:54 AM 06/26/2022    9:38 AM 04/25/2022   11:17 AM 02/27/2022    3:42 PM 09/12/2021    8:54 AM  PHQ 2/9 Scores  PHQ - 2 Score 0 0 0 0 0 0 0  PHQ- 9 Score   0 0 0 0 0    Fall Risk     05/02/2023    4:46 PM 03/31/2023   10:22 AM 09/26/2022     9:54 AM 06/26/2022    9:38 AM 04/25/2022   11:16 AM  Fall Risk   Falls in the past year? 0 0 0 0 1  Number falls in past yr: 0  0  1  Injury with Fall? 0  0  1  Risk for fall due to : No Fall Risks  No Fall Risks  History of fall(s);Impaired balance/gait;Orthopedic patient  Follow up Falls prevention discussed;Education provided;Falls evaluation completed  Falls evaluation completed  Education provided;Falls prevention discussed    MEDICARE RISK AT HOME:  Medicare Risk at Home Any stairs in or around the home?: No If so, are there any without handrails?: No Home free of loose throw rugs in walkways, pet beds, electrical cords, etc?: Yes Adequate lighting in your home to reduce risk of falls?: Yes Life alert?: No Use of a cane, walker or w/c?: No Grab bars in the bathroom?: Yes Shower chair or bench in shower?: No Elevated toilet seat or a handicapped toilet?: Yes  TIMED UP AND GO:  Was the test performed?  No  Cognitive Function: 6CIT completed        05/02/2023    4:46 PM 04/25/2022   11:18 AM 04/24/2021   12:24 PM  6CIT Screen  What Year? 0 points 0 points 0 points  What month? 0 points 0 points 0 points  What time? 0 points 0 points 0 points  Count back from 20 0 points 0 points 0 points  Months in reverse 0 points 0 points 4 points  Repeat phrase 0 points 0 points 0 points  Total Score 0 points 0 points 4 points    Immunizations Immunization History  Administered Date(s) Administered   Fluad Quad(high Dose 65+) 05/17/2020   Fluad Trivalent(High Dose 65+) 12/27/2022   Moderna Sars-Covid-2 Vaccination 07/13/2019, 08/17/2019   PNEUMOCOCCAL CONJUGATE-20 12/27/2022   Pneumococcal Conjugate-13 06/05/2016   Tdap 01/18/1999, 01/02/2005, 08/14/2016, 03/10/2021   Zoster Recombinant(Shingrix) 06/05/2016, 08/14/2016  Screening Tests Health Maintenance  Topic Date Due   Hepatitis C Screening  09/26/2023 (Originally 11/10/1968)   COVID-19 Vaccine (3 - 2024-25 season)  01/12/2024 (Originally 11/10/2022)   OPHTHALMOLOGY EXAM  08/13/2023   FOOT EXAM  09/26/2023   HEMOGLOBIN A1C  09/28/2023   Diabetic kidney evaluation - Urine ACR  12/27/2023   Diabetic kidney evaluation - eGFR measurement  03/30/2024   Medicare Annual Wellness (AWV)  05/01/2024   DTaP/Tdap/Td (5 - Td or Tdap) 03/11/2031   Pneumonia Vaccine 60+ Years old  Completed   INFLUENZA VACCINE  Completed   Zoster Vaccines- Shingrix  Completed   HPV VACCINES  Aged Out   Colonoscopy  Discontinued    Health Maintenance  There are no preventive care reminders to display for this patient.  Additional Screening:  Vision Screening: Recommended annual ophthalmology exams for early detection of glaucoma and other disorders of the eye.  Dental Screening: Recommended annual dental exams for proper oral hygiene  Community Resource Referral / Chronic Care Management: CRR required this visit?  No   CCM required this visit?  No     Plan:     I have personally reviewed and noted the following in the patient's chart:   Medical and social history Use of alcohol, tobacco or illicit drugs  Current medications and supplements including opioid prescriptions. Patient is not currently taking opioid prescriptions. Functional ability and status Nutritional status Physical activity Advanced directives List of other physicians Hospitalizations, surgeries, and ER visits in previous 12 months Vitals Screenings to include cognitive, depression, and falls Referrals and appointments  In addition, I have reviewed and discussed with patient certain preventive protocols, quality metrics, and best practice recommendations. A written personalized care plan for preventive services as well as general preventive health recommendations were provided to patient.     Kandis Fantasia [place], [place]   1/61/0960   After Visit Summary: (Mail) Due to this being a telephonic visit, the after visit summary with patients  personalized plan was offered to patient via mail   Notes: Nothing significant to report at this time.

## 2023-05-08 ENCOUNTER — Ambulatory Visit: Payer: 59 | Admitting: Family Medicine

## 2023-05-14 ENCOUNTER — Ambulatory Visit (INDEPENDENT_AMBULATORY_CARE_PROVIDER_SITE_OTHER): Admitting: Family Medicine

## 2023-05-14 ENCOUNTER — Ambulatory Visit (INDEPENDENT_AMBULATORY_CARE_PROVIDER_SITE_OTHER)

## 2023-05-14 ENCOUNTER — Encounter: Payer: Self-pay | Admitting: Family Medicine

## 2023-05-14 VITALS — BP 111/71 | HR 89 | Temp 98.2°F | Ht 66.0 in | Wt 181.0 lb

## 2023-05-14 DIAGNOSIS — J4520 Mild intermittent asthma, uncomplicated: Secondary | ICD-10-CM | POA: Diagnosis not present

## 2023-05-14 DIAGNOSIS — R0981 Nasal congestion: Secondary | ICD-10-CM

## 2023-05-14 DIAGNOSIS — R0689 Other abnormalities of breathing: Secondary | ICD-10-CM | POA: Diagnosis not present

## 2023-05-14 DIAGNOSIS — Z7984 Long term (current) use of oral hypoglycemic drugs: Secondary | ICD-10-CM

## 2023-05-14 DIAGNOSIS — E1140 Type 2 diabetes mellitus with diabetic neuropathy, unspecified: Secondary | ICD-10-CM | POA: Diagnosis not present

## 2023-05-14 MED ORDER — DOXYCYCLINE HYCLATE 100 MG PO TABS: 100.0000 mg | ORAL_TABLET | Freq: Two times a day (BID) | ORAL | 0 refills | Status: AC

## 2023-05-14 MED ORDER — ACCU-CHEK GUIDE W/DEVICE KIT: PACK | 0 refills | Status: AC

## 2023-05-14 MED ORDER — PREDNISONE 20 MG PO TABS: 40.0000 mg | ORAL_TABLET | Freq: Every day | ORAL | 0 refills | Status: AC

## 2023-05-14 NOTE — Progress Notes (Signed)
 Subjective:  Patient ID: Anthony Swanson, male    DOB: [birth year], 73 y.o.   MRN: [id number]  Patient Care Team: Gabriel Earing, FNP as PCP - General (Family Medicine) Delora Fuel, OD (Optometry) Cresenciano Genre Lilla Shook, [facility] as Pharmacist (Family Medicine)   Chief Complaint:  cough/congestion  HPI: Anthony Swanson is a 73 y.o. male presenting on 05/14/2023 for cough/congestion Reports that  Headaches, cough, congestion, rhinorhea, productive cough with yellow mucus. Denies fever, N/V, shortness of breath. He lives on the second story of an apartment. States that he is managing the stairs with slow and steady steps. Has a history of asthma. He has albuterol inhaler. He is using it 1-2 times per days. Using nebulizer once per day in the last 2 weeks due to illness.   Relevant past medical, surgical, family, and social history reviewed and updated as indicated.  Allergies and medications reviewed and updated. Data reviewed: Chart in Epic.  Past Medical History:  Diagnosis Date   Allergy    Arthritis    Asthma    Diabetes mellitus    Hyperlipidemia    Hypertension    Myocardial infarction [facility])     Past Surgical History:  Procedure Laterality Date   APPENDECTOMY  2020    Social History   Socioeconomic History   Marital status: Widowed    Spouse name: Not on file   Number of children: 4   Years of education: 8   Highest education level: 8th grade  Occupational History   Occupation: retired  Tobacco Use   Smoking status: Former    Current packs/day: 0.00    Average packs/day: 1 pack/day for 6.0 years (6.0 ttl pk-yrs)    Types: Cigarettes    Start date: 44    Quit date: 1977    Years since quitting: 48.2   Smokeless tobacco: Current    Types: Chew  Vaping Use   Vaping status: Never Used  Substance and Sexual Activity   Alcohol use: No   Drug use: No   Sexual activity: Not Currently    Birth control/protection: None  Other Topics Concern   Not on file  Social  History Narrative   Lives alone - no family around here   Goes to church   4 children - they don't visit much   Social Drivers of Health   Financial Resource Strain: Low Risk  (05/02/2023)   Overall Financial Resource Strain (CARDIA)    Difficulty of Paying Living Expenses: Not hard at all  Food Insecurity: No Food Insecurity (05/02/2023)   Hunger Vital Sign    Worried About Running Out of Food in the Last Year: Never true    Ran Out of Food in the Last Year: Never true  Transportation Needs: No Transportation Needs (05/02/2023)   PRAPARE - Administrator, Civil Service (Medical): No    Lack of Transportation (Non-Medical): No  Physical Activity: Inactive (05/02/2023)   Exercise Vital Sign    Days of Exercise per Week: 0 days    Minutes of Exercise per Session: 0 min  Stress: No Stress Concern Present (05/02/2023)   [employer] of Occupational Health - Occupational Stress Questionnaire    Feeling of Stress : Not at all  Social Connections: Socially Isolated (05/02/2023)   Social Connection and Isolation Panel [NHANES]    Frequency of Communication with Friends and Family: More than three times a week    Frequency of Social Gatherings with Friends and  Family: Three times a week    Attends Religious Services: Never    Active Member of Clubs or Organizations: No    Attends Banker Meetings: Never    Marital Status: Widowed  Intimate Partner Violence: Not At Risk (05/02/2023)   Humiliation, Afraid, Rape, and Kick questionnaire    Fear of Current or Ex-Partner: No    Emotionally Abused: No    Physically Abused: No    Sexually Abused: No    Outpatient Encounter Medications as of 05/14/2023  Medication Sig   ACCU-CHEK GUIDE test strip Test BS daily Dx E11.9   Accu-Chek Softclix Lancets lancets USE TO TEST BLOOD SUGAR DAILY Dx E11.40   acetaminophen (TYLENOL) 500 MG tablet Take 500 mg by mouth every 6 (six) hours as needed. For pain   albuterol (VENTOLIN  HFA) 108 (90 Base) MCG/ACT inhaler INHALE 2 PUFFS BY MOUTH EVERY 6 HOURS AS NEEDED SHORTNESS OF BREATH   Albuterol Sulfate 2.5 MG/0.5ML NEBU dilute ONE vial WITH 2.37ml of Sodium Chloride in NEBULIZER FOUR TIMES DAILY AS NEEDED   Blood Glucose Monitoring Suppl (ACCU-CHEK GUIDE) w/Device KIT Test BS daily Dx E11.9   cyclobenzaprine (FLEXERIL) 5 MG tablet TAKE 1 TABLET BY MOUTH THREE TIMES DAILY AS NEEDED FOR MUSCLE SPASMS   famotidine (PEPCID) 20 MG tablet TAKE 1 TABLET BY MOUTH EVERY DAY AFTER DINNER   glipiZIDE (GLUCOTROL) 10 MG tablet Take 1 tablet (10 mg total) by mouth 2 (two) times daily.   hydrochlorothiazide (HYDRODIURIL) 12.5 MG tablet TAKE 1 TABLET BY MOUTH DAILY   JARDIANCE 25 MG TABS tablet TAKE 1 TABLET BY MOUTH EVERY DAY BEFORE BREAKFAST   lidocaine (XYLOCAINE) 5 % ointment apply ONE application topically AS NEEDED   losartan (COZAAR) 50 MG tablet Take 1 tablet (50 mg total) by mouth daily.   omeprazole (PRILOSEC) 40 MG capsule TAKE ONE CAPSULE BY MOUTH EVERY MORNING   RYBELSUS 14 MG TABS TAKE 1 TABLET BY MOUTH DAILY   simvastatin (ZOCOR) 20 MG tablet Take 1 tablet (20 mg total) by mouth every morning.   No facility-administered encounter medications on file as of 05/14/2023.    Allergies  Allergen Reactions   Codeine    Penicillins    Percocet [Oxycodone-Acetaminophen]    Sulfa Antibiotics     Review of Systems As per HPI  Objective:  BP 111/71   Pulse 89   Temp 98.2 F (36.8 C)   Ht 5\' 6"  (1.676 m)   Wt 181 lb (82.1 kg)   SpO2 94%   BMI 29.21 kg/m    Wt Readings from Last 3 Encounters:  05/14/23 181 lb (82.1 kg)  05/02/23 187 lb (84.8 kg)  03/31/23 187 lb (84.8 kg)   Physical Exam Constitutional:      General: He is awake. He is not in acute distress.    Appearance: Normal appearance. He is well-groomed. He is ill-appearing. He is not toxic-appearing or diaphoretic.  HENT:     Right Ear: No laceration, drainage, swelling or tenderness. A middle ear effusion  is present. There is no impacted cerumen. No foreign body. No mastoid tenderness. No PE tube. No hemotympanum. Tympanic membrane is not injected, scarred, perforated, erythematous, retracted or bulging.     Left Ear: No laceration, drainage, swelling or tenderness. A middle ear effusion is present. There is no impacted cerumen. No foreign body. No mastoid tenderness. No PE tube. No hemotympanum. Tympanic membrane is not injected, scarred, perforated, erythematous, retracted or bulging.  Nose: Congestion and rhinorrhea present. Rhinorrhea is clear.     Right Sinus: Frontal sinus tenderness present. No maxillary sinus tenderness.     Left Sinus: Frontal sinus tenderness present. No maxillary sinus tenderness.     Mouth/Throat:     Lips: Pink. No lesions.     Pharynx: Posterior oropharyngeal erythema present. No postnasal drip.     Tonsils: No tonsillar exudate or tonsillar abscesses.  Cardiovascular:     Rate and Rhythm: Normal rate and regular rhythm.     Heart sounds: Normal heart sounds.  Pulmonary:     Breath sounds: Examination of the right-upper field reveals wheezing. Examination of the left-upper field reveals decreased breath sounds and wheezing. Examination of the left-middle field reveals decreased breath sounds. Decreased breath sounds and wheezing present. No rhonchi or rales.  Lymphadenopathy:     Head:     Right side of head: No submental, submandibular, tonsillar, preauricular or posterior auricular adenopathy.     Left side of head: No submental, submandibular, tonsillar, preauricular or posterior auricular adenopathy.     Cervical:     Right cervical: No superficial cervical adenopathy.    Left cervical: No superficial cervical adenopathy.  Skin:    General: Skin is warm.     Capillary Refill: Capillary refill takes less than 2 seconds.  Neurological:     General: No focal deficit present.     Mental Status: He is alert, oriented to person, place, and time and easily  aroused. Mental status is at baseline.  Psychiatric:        Attention and Perception: Attention and perception normal.        Mood and Affect: Mood and affect normal.        Speech: Speech normal.        Behavior: Behavior is cooperative.        Thought Content: Thought content normal.     Results for orders placed or performed in visit on 03/31/23  Bayer DCA Hb A1c Waived   Collection Time: 03/31/23 10:26 AM  Result Value Ref Range   HB A1C (BAYER DCA - WAIVED) 6.8 (H) 4.8 - 5.6 %  CBC with Differential/Platelet   Collection Time: 03/31/23 10:29 AM  Result Value Ref Range   WBC 8.1 3.4 - 10.8 x10E3/uL   RBC 5.47 4.14 - 5.80 x10E6/uL   Hemoglobin 16.2 13.0 - 17.7 g/dL   Hematocrit 16.1 09.6 - 51.0 %   MCV 89 79 - 97 fL   MCH 29.6 26.6 - 33.0 pg   MCHC 33.3 31.5 - 35.7 g/dL   RDW 04.5 40.9 - 81.1 %   Platelets 257 150 - 450 x10E3/uL   Neutrophils 71 Not Estab. %   Lymphs 20 Not Estab. %   Monocytes 6 Not Estab. %   Eos 2 Not Estab. %   Basos 0 Not Estab. %   Neutrophils Absolute 5.8 1.4 - 7.0 x10E3/uL   Lymphocytes Absolute 1.6 0.7 - 3.1 x10E3/uL   Monocytes Absolute 0.5 0.1 - 0.9 x10E3/uL   EOS (ABSOLUTE) 0.2 0.0 - 0.4 x10E3/uL   Basophils Absolute 0.0 0.0 - 0.2 x10E3/uL   Immature Granulocytes 1 Not Estab. %   Immature Grans (Abs) 0.0 0.0 - 0.1 x10E3/uL  CMP14+EGFR   Collection Time: 03/31/23 10:29 AM  Result Value Ref Range   Glucose 177 (H) 70 - 99 mg/dL   BUN 14 8 - 27 mg/dL   Creatinine, Ser 9.14 0.76 - 1.27 mg/dL   eGFR  74 >59 mL/min/1.73   BUN/Creatinine Ratio 13 10 - 24   Sodium 136 134 - 144 mmol/L   Potassium 4.3 3.5 - 5.2 mmol/L   Chloride 99 96 - 106 mmol/L   CO2 24 20 - 29 mmol/L   Calcium 9.6 8.6 - 10.2 mg/dL   Total Protein 6.9 6.0 - 8.5 g/dL   Albumin 4.6 3.8 - 4.8 g/dL   Globulin, Total 2.3 1.5 - 4.5 g/dL   Bilirubin Total 0.3 0.0 - 1.2 mg/dL   Alkaline Phosphatase 60 44 - 121 IU/L   AST 15 0 - 40 IU/L   ALT 20 0 - 44 IU/L  Lipid panel    Collection Time: 03/31/23 10:29 AM  Result Value Ref Range   Cholesterol, Total 138 100 - 199 mg/dL   Triglycerides 409 (H) 0 - 149 mg/dL   HDL 48 >81 mg/dL   VLDL Cholesterol Cal 26 5 - 40 mg/dL   LDL Chol Calc (NIH) 64 0 - 99 mg/dL   Chol/HDL Ratio 2.9 0.0 - 5.0 ratio       05/14/2023    2:01 PM 05/02/2023    4:44 PM 03/31/2023   10:22 AM 09/26/2022    9:54 AM 06/26/2022    9:38 AM  Depression screen PHQ 2/9  Decreased Interest 0 0 0 0 0  Down, Depressed, Hopeless 0 0 0 0 0  PHQ - 2 Score 0 0 0 0 0  Altered sleeping    0 0  Tired, decreased energy    0 0  Change in appetite    0 0  Feeling bad or failure about yourself     0 0  Trouble concentrating    0 0  Moving slowly or fidgety/restless    0 0  Suicidal thoughts    0 0  PHQ-9 Score    0 0  Difficult doing work/chores Not difficult at all   Not difficult at all Not difficult at all       05/14/2023    2:01 PM 09/26/2022    9:54 AM 06/26/2022    9:39 AM 02/27/2022    3:42 PM  GAD 7 : Generalized Anxiety Score  Nervous, Anxious, on Edge 0 0 0 0  Control/stop worrying 0 0 0 0  Worry too much - different things 0 0 0 0  Trouble relaxing 0 0 0 0  Restless 0 0 0 0  Easily annoyed or irritable 0 0 0 0  Afraid - awful might happen 0 0 0 0  Total GAD 7 Score 0 0 0 0  Anxiety Difficulty Not difficult at all Not difficult at all Not difficult at all Not difficult at all   Pertinent labs & imaging results that were available during my care of the patient were reviewed by me and considered in my medical decision making.  Assessment & Plan:  Amarian was seen today for cough/congestion.  Diagnoses and all orders for this visit:  Decreased breath sounds in left mid-lung field Imaging as below. Will communicate results to patient once available. Will await results to determine next steps.  Will send in medications as below for infection complicated by asthma. Discussed with patient that prednisone can increase BG levels and to  monitor at home. He did not have meter at home. Provided sample in office and ordered new kit for patient. Discussed red flags.  -     DG Chest 2 View; Future -     predniSONE (DELTASONE) 20  MG tablet; Take 2 tablets (40 mg total) by mouth daily with breakfast for 5 days. -     doxycycline (VIBRA-TABS) 100 MG tablet; Take 1 tablet (100 mg total) by mouth 2 (two) times daily for 7 days.  Mild intermittent asthma without complication As above.  -     predniSONE (DELTASONE) 20 MG tablet; Take 2 tablets (40 mg total) by mouth daily with breakfast for 5 days. -     doxycycline (VIBRA-TABS) 100 MG tablet; Take 1 tablet (100 mg total) by mouth 2 (two) times daily for 7 days.  Congestion of nasal sinus As above.  -     predniSONE (DELTASONE) 20 MG tablet; Take 2 tablets (40 mg total) by mouth daily with breakfast for 5 days. -     doxycycline (VIBRA-TABS) 100 MG tablet; Take 1 tablet (100 mg total) by mouth 2 (two) times daily for 7 days.  Type 2 diabetes mellitus with diabetic neuropathy, without long-term current use of insulin (HCC) As above.  -     Blood Glucose Monitoring Suppl (ACCU-CHEK GUIDE) w/Device KIT; Test BS daily Dx E11.9  Continue all other maintenance medications.  Follow up plan: Return if symptoms worsen or fail to improve.   Continue healthy lifestyle choices, including diet (rich in fruits, vegetables, and lean proteins, and low in salt and simple carbohydrates) and exercise (at least 30 minutes of moderate physical activity daily).  Written and verbal instructions provided   The above assessment and management plan was discussed with the patient. The patient verbalized understanding of and has agreed to the management plan. Patient is aware to call the clinic if they develop any new symptoms or if symptoms persist or worsen. Patient is aware when to return to the clinic for a follow-up visit. Patient educated on when it is appropriate to go to the emergency department.    Neale Burly, DNP-FNP [facility] [address] [place], [place] [address] [phone]

## 2023-05-15 ENCOUNTER — Other Ambulatory Visit: Payer: Self-pay | Admitting: Family Medicine

## 2023-05-15 DIAGNOSIS — E119 Type 2 diabetes mellitus without complications: Secondary | ICD-10-CM

## 2023-05-15 DIAGNOSIS — E1140 Type 2 diabetes mellitus with diabetic neuropathy, unspecified: Secondary | ICD-10-CM

## 2023-05-15 DIAGNOSIS — K219 Gastro-esophageal reflux disease without esophagitis: Secondary | ICD-10-CM

## 2023-05-15 NOTE — Progress Notes (Signed)
 No acute findings on Xray. Please follow up if symptoms continue.

## 2023-06-30 ENCOUNTER — Telehealth: Payer: Self-pay | Admitting: Family Medicine

## 2023-06-30 NOTE — Telephone Encounter (Signed)
Appt rescheduled to August.

## 2023-06-30 NOTE — Telephone Encounter (Signed)
 Copied from CRM [id number]. Topic: Appointments - Appointment Cancel/Reschedule >> Jun 30, 2023 12:39 PM Bearl Botts E wrote: Patient/patient representative is calling to cancel or reschedule an appointment. Refer to attachments for appointment information.   Pt wants to reschedule his appt from May 1st to May 7th   Best contact: [phone]

## 2023-07-04 ENCOUNTER — Other Ambulatory Visit: Payer: Self-pay | Admitting: Family Medicine

## 2023-07-04 DIAGNOSIS — M62838 Other muscle spasm: Secondary | ICD-10-CM

## 2023-07-09 ENCOUNTER — Other Ambulatory Visit: Payer: Self-pay | Admitting: Family Medicine

## 2023-07-09 DIAGNOSIS — J4540 Moderate persistent asthma, uncomplicated: Secondary | ICD-10-CM

## 2023-07-10 ENCOUNTER — Encounter: Payer: 59 | Admitting: Family Medicine

## 2023-07-14 ENCOUNTER — Other Ambulatory Visit: Payer: Self-pay | Admitting: Family Medicine

## 2023-07-14 DIAGNOSIS — E1169 Type 2 diabetes mellitus with other specified complication: Secondary | ICD-10-CM

## 2023-07-14 DIAGNOSIS — I152 Hypertension secondary to endocrine disorders: Secondary | ICD-10-CM

## 2023-07-14 DIAGNOSIS — E1140 Type 2 diabetes mellitus with diabetic neuropathy, unspecified: Secondary | ICD-10-CM

## 2023-08-13 ENCOUNTER — Other Ambulatory Visit: Payer: Self-pay | Admitting: Family Medicine

## 2023-08-13 DIAGNOSIS — K219 Gastro-esophageal reflux disease without esophagitis: Secondary | ICD-10-CM

## 2023-08-13 DIAGNOSIS — E1140 Type 2 diabetes mellitus with diabetic neuropathy, unspecified: Secondary | ICD-10-CM

## 2023-08-24 LAB — MICROALBUMIN / CREATININE URINE RATIO: Microalb Creat Ratio: <30

## 2023-08-25 ENCOUNTER — Ambulatory Visit: Admitting: Family Medicine

## 2023-08-25 ENCOUNTER — Encounter: Payer: Self-pay | Admitting: Family Medicine

## 2023-08-25 VITALS — BP 123/77 | HR 94 | Temp 98.0°F | Ht 66.0 in | Wt 182.2 lb

## 2023-08-25 DIAGNOSIS — I152 Hypertension secondary to endocrine disorders: Secondary | ICD-10-CM

## 2023-08-25 DIAGNOSIS — E785 Hyperlipidemia, unspecified: Secondary | ICD-10-CM

## 2023-08-25 DIAGNOSIS — Z6830 Body mass index (BMI) 30.0-30.9, adult: Secondary | ICD-10-CM

## 2023-08-25 DIAGNOSIS — R209 Unspecified disturbances of skin sensation: Secondary | ICD-10-CM

## 2023-08-25 DIAGNOSIS — J4540 Moderate persistent asthma, uncomplicated: Secondary | ICD-10-CM | POA: Diagnosis not present

## 2023-08-25 DIAGNOSIS — G629 Polyneuropathy, unspecified: Secondary | ICD-10-CM

## 2023-08-25 DIAGNOSIS — Z0000 Encounter for general adult medical examination without abnormal findings: Secondary | ICD-10-CM

## 2023-08-25 DIAGNOSIS — E1169 Type 2 diabetes mellitus with other specified complication: Secondary | ICD-10-CM | POA: Diagnosis not present

## 2023-08-25 DIAGNOSIS — Z0001 Encounter for general adult medical examination with abnormal findings: Secondary | ICD-10-CM

## 2023-08-25 DIAGNOSIS — K219 Gastro-esophageal reflux disease without esophagitis: Secondary | ICD-10-CM

## 2023-08-25 DIAGNOSIS — I252 Old myocardial infarction: Secondary | ICD-10-CM

## 2023-08-25 DIAGNOSIS — E1140 Type 2 diabetes mellitus with diabetic neuropathy, unspecified: Secondary | ICD-10-CM

## 2023-08-25 DIAGNOSIS — E66811 Obesity, class 1: Secondary | ICD-10-CM

## 2023-08-25 DIAGNOSIS — R0989 Other specified symptoms and signs involving the circulatory and respiratory systems: Secondary | ICD-10-CM

## 2023-08-25 DIAGNOSIS — E1159 Type 2 diabetes mellitus with other circulatory complications: Secondary | ICD-10-CM

## 2023-08-25 LAB — BAYER DCA HB A1C WAIVED: HB A1C (BAYER DCA - WAIVED): 6.7 % — ABNORMAL HIGH (ref 4.8–5.6)

## 2023-08-25 MED ORDER — FLUTICASONE-SALMETEROL 100-50 MCG/ACT IN AEPB
1.0000 | INHALATION_SPRAY | Freq: Two times a day (BID) | RESPIRATORY_TRACT | 3 refills | Status: AC
Start: 2023-08-25 — End: ?

## 2023-08-25 MED ORDER — GABAPENTIN 300 MG PO CAPS: 300.0000 mg | ORAL_CAPSULE | Freq: Two times a day (BID) | ORAL | 3 refills | Status: AC

## 2023-08-25 NOTE — Progress Notes (Signed)
 Complete physical exam  Patient: Anthony Swanson   DOB: March 21, 1950   73 y.o. Male  MRN: [id number]  Subjective:    Chief Complaint  Patient presents with   Annual Exam    Anthony Swanson is a 73 y.o. male who presents today for a complete physical exam. He reports consuming a general diet. Exercise is limited by neurologic condition(s): neuropathy. He generally feels fairly well. He reports sleeping fairly well. He has had some difficulty falling sleep lately.  He does have additional problems to discuss today.   Previously stopped gabapentin  on his own. Having increased pain since then and would like to restart gabapentin . Also had to move to 2nd level apartment. Has difficulty with stairs due to neuropathy that has nearly fallen x2 because of this.   Also having increased asthma symptoms since moving apartments. New apartment has carpeting. He has been having to use nebulizer 2-3x a day for wheezing since moving into the apartment a month ago.   He will moving out of this apartment on the 26th but needs letter for meidcal necessity so he doesn't lose his deposit. He will be moving back into his old apartment that doesn't have carpeting or stairs.   Nurse came out with insurance. Recommend that he have ABIs done. He does report cold toes. Denies claudication.   Denies chest pain, edema, palpations, dizziness.   Most recent fall risk assessment:    08/25/2023    9:45 AM  Fall Risk   Falls in the past year? 0     Most recent depression screenings:    08/25/2023    9:45 AM 05/14/2023    2:01 PM  PHQ 2/9 Scores  PHQ - 2 Score 0 0  PHQ- 9 Score 0         Patient Care Team: Albertha Huger, FNP as PCP - General (Family Medicine) Alexia Idler, OD (Optometry) Delilah Fend, [facility] as Pharmacist (Family Medicine)   Outpatient Medications Prior to Visit  Medication Sig   ACCU-CHEK GUIDE TEST test strip TEST BLOOD SUGAR DAILY Dx E11.40   Accu-Chek Softclix Lancets lancets  TEST BLOOD SUGAR DAILY Dx E11.40   acetaminophen  (TYLENOL ) 500 MG tablet Take 500 mg by mouth every 6 (six) hours as needed. For pain   albuterol  (VENTOLIN  HFA) 108 (90 Base) MCG/ACT inhaler INHALE 2 PUFFS BY MOUTH EVERY 6 HOURS AS NEEDED SHORTNESS OF BREATH   Albuterol  Sulfate 2.5 MG/0.5ML NEBU dilute ONE vial WITH 2.5ml of Sodium Chloride in NEBULIZER FOUR TIMES DAILY AS NEEDED   Blood Glucose Monitoring Suppl (ACCU-CHEK GUIDE) w/Device KIT Test BS daily Dx E11.9   cyclobenzaprine  (FLEXERIL ) 5 MG tablet TAKE 1 TABLET BY MOUTH THREE TIMES DAILY AS NEEDED FOR MUSCLE SPASMS   famotidine  (PEPCID ) 20 MG tablet TAKE 1 TABLET BY MOUTH EVERY DAY AFTER DINNER   glipiZIDE  (GLUCOTROL ) 10 MG tablet TAKE 1 TABLET BY MOUTH TWICE DAILY   hydrochlorothiazide  (HYDRODIURIL ) 12.5 MG tablet TAKE 1 TABLET BY MOUTH DAILY   JARDIANCE  25 MG TABS tablet TAKE 1 TABLET BY MOUTH EVERY DAY BEFORE BREAKFAST   lidocaine  (XYLOCAINE ) 5 % ointment apply ONE application topically AS NEEDED   losartan  (COZAAR ) 50 MG tablet TAKE 1 TABLET BY MOUTH DAILY   omeprazole  (PRILOSEC) 40 MG capsule TAKE ONE CAPSULE BY MOUTH EVERY MORNING   RYBELSUS  14 MG TABS TAKE 1 TABLET BY MOUTH DAILY   simvastatin  (ZOCOR ) 20 MG tablet TAKE 1 TABLET BY MOUTH EVERY MORNING  No facility-administered medications prior to visit.    ROS As per HPI.     Objective:     BP 123/77   Pulse 94   Temp 98 F (36.7 C) (Temporal)   Ht 5' 6 (1.676 m)   Wt 182 lb 3.2 oz (82.6 kg)   SpO2 96%   BMI 29.41 kg/m    Physical Exam Vitals and nursing note reviewed.  Constitutional:      General: He is not in acute distress.    Appearance: He is not ill-appearing, toxic-appearing or diaphoretic.  HENT:     Head: Normocephalic.     Right Ear: Tympanic membrane, ear canal and external ear normal.     Left Ear: Tympanic membrane, ear canal and external ear normal.     Nose: Nose normal.     Mouth/Throat:     Mouth: Mucous membranes are moist.      Pharynx: Oropharynx is clear.   Eyes:     Extraocular Movements: Extraocular movements intact.     Conjunctiva/sclera: Conjunctivae normal.     Pupils: Pupils are equal, round, and reactive to light.   Neck:     Thyroid: No thyroid mass, thyromegaly or thyroid tenderness.   Cardiovascular:     Rate and Rhythm: Normal rate and regular rhythm.     Pulses:          Dorsalis pedis pulses are 1+ on the right side and 1+ on the left side.       Posterior tibial pulses are 1+ on the right side and 1+ on the left side.     Heart sounds: Normal heart sounds. No murmur heard.    No friction rub. No gallop.  Pulmonary:     Effort: Pulmonary effort is normal.     Breath sounds: Normal breath sounds.  Abdominal:     General: Bowel sounds are normal. There is no distension.     Palpations: Abdomen is soft. There is no mass.     Tenderness: There is no abdominal tenderness. There is no guarding.   Musculoskeletal:     Cervical back: Normal range of motion and neck supple. No tenderness.     Right lower leg: No edema.     Left lower leg: No edema.   Skin:    General: Skin is warm and dry.     Capillary Refill: Capillary refill takes less than 2 seconds.     Findings: No lesion or rash.   Neurological:     General: No focal deficit present.     Mental Status: He is alert and oriented to person, place, and time.   Psychiatric:        Mood and Affect: Mood normal.        Behavior: Behavior normal.        Thought Content: Thought content normal.      No results found for any visits on 08/25/23.     Assessment & Plan:    Routine Health Maintenance and Physical Exam  Lamarius was seen today for annual exam.  Diagnoses and all orders for this visit:  Encounter for Medicare annual wellness exam  Type 2 diabetes mellitus with diabetic neuropathy, without long-term current use of insulin (HCC) A1c 6.7, at goal of <7. On ARB and statin.  -     Bayer DCA Hb A1c Waived -     Cancel:  Ambulatory referral to Vascular Surgery -     Ambulatory referral to Vascular Surgery  Hypertension associated with diabetes (HCC) Well controlled on current regimen.   Hyperlipidemia associated with type 2 diabetes mellitus (HCC) On statin.   Class 1 obesity due to excess calories with serious comorbidity and body mass index (BMI) of 30.0 to 30.9 in adult Diet, exercise as tolerated.   Gastroesophageal reflux disease without esophagitis Well controlled on current regimen.   Moderate persistent asthma without complication Uncontrolled. Start advair. Letter given for apartment without carpeting.  -     fluticasone-salmeterol (ADVAIR DISKUS) 100-50 MCG/ACT AEPB; Inhale 1 puff into the lungs 2 (two) times daily.  History of myocardial infarction  Neuropathy Uncontrolled. Restart gabapentin . Start with 1 tablet at bedtime for a few days, then increased to BID.  -     gabapentin  (NEURONTIN ) 300 MG capsule; Take 1 capsule (300 mg total) by mouth 2 (two) times daily.  Cold extremities Diminished pulses in lower extremity Referral discussed and placed as below.  -     Cancel: Ambulatory referral to Vascular Surgery -     Ambulatory referral to Vascular Surgery    Immunization History  Administered Date(s) Administered   Fluad Quad(high Dose 65+) 05/17/2020   Fluad Trivalent(High Dose 65+) 12/27/2022   Moderna Sars-Covid-2 Vaccination 07/13/2019, 08/17/2019   PNEUMOCOCCAL CONJUGATE-20 12/27/2022   Pneumococcal Conjugate-13 06/05/2016   Tdap 01/18/1999, 01/02/2005, 08/14/2016, 03/10/2021   Zoster Recombinant(Shingrix) 06/05/2016, 08/14/2016    Health Maintenance  Topic Date Due   OPHTHALMOLOGY EXAM  08/13/2023   Hepatitis C Screening  09/26/2023 (Originally 11/10/1968)   COVID-19 Vaccine (3 - 2024-25 season) 01/12/2024 (Originally 11/10/2022)   FOOT EXAM  09/26/2023   HEMOGLOBIN A1C  09/28/2023   INFLUENZA VACCINE  10/10/2023   Diabetic kidney evaluation - Urine ACR  12/27/2023    Diabetic kidney evaluation - eGFR measurement  03/30/2024   Medicare Annual Wellness (AWV)  08/24/2024   DTaP/Tdap/Td (5 - Td or Tdap) 03/11/2031   Pneumococcal Vaccine: 50+ Years  Completed   Zoster Vaccines- Shingrix  Completed   HPV VACCINES  Aged Out   Meningococcal B Vaccine  Aged Out   Colonoscopy  Discontinued    Discussed health benefits of physical activity, and encouraged him to engage in regular exercise appropriate for his age and condition.  Problem List Items Addressed This Visit       Cardiovascular and Mediastinum   Hypertension associated with diabetes (HCC)     Respiratory   Asthma   Relevant Medications   fluticasone-salmeterol (ADVAIR DISKUS) 100-50 MCG/ACT AEPB     Digestive   Gastroesophageal reflux disease without esophagitis     Endocrine   Type 2 diabetes mellitus with diabetic neuropathy, without long-term current use of insulin (HCC)   Relevant Orders   Bayer DCA Hb A1c Waived   Ambulatory referral to Vascular Surgery   Hyperlipidemia associated with type 2 diabetes mellitus (HCC)     Nervous and Auditory   Neuropathy   Relevant Medications   gabapentin  (NEURONTIN ) 300 MG capsule     Other   History of myocardial infarction   Class 1 obesity due to excess calories with serious comorbidity and body mass index (BMI) of 30.0 to 30.9 in adult   Other Visit Diagnoses       Encounter for Medicare annual wellness exam    -  Primary     Cold extremities       Relevant Orders   Ambulatory referral to Vascular Surgery     Diminished pulses in lower extremity  Relevant Orders   Ambulatory referral to Vascular Surgery      Return in about 3 months (around 11/25/2023) for chronic follow up.   The patient indicates understanding of these issues and agrees with the plan.  Albertha Huger, FNP

## 2023-08-25 NOTE — Addendum Note (Signed)
 Addended by: Albertha Huger on: 08/25/2023 02:26 PM   Modules accepted: Level of Service

## 2023-09-03 ENCOUNTER — Other Ambulatory Visit: Payer: Self-pay | Admitting: Family Medicine

## 2023-09-03 DIAGNOSIS — M62838 Other muscle spasm: Secondary | ICD-10-CM

## 2023-09-15 ENCOUNTER — Telehealth: Payer: Self-pay

## 2023-09-15 NOTE — Telephone Encounter (Signed)
PCP not at this office

## 2023-09-16 ENCOUNTER — Ambulatory Visit: Payer: Self-pay | Admitting: Family Medicine

## 2023-10-03 ENCOUNTER — Other Ambulatory Visit: Payer: Self-pay | Admitting: Family Medicine

## 2023-10-03 DIAGNOSIS — J4540 Moderate persistent asthma, uncomplicated: Secondary | ICD-10-CM

## 2023-10-14 ENCOUNTER — Other Ambulatory Visit: Payer: Self-pay | Admitting: Family Medicine

## 2023-10-14 DIAGNOSIS — E1169 Type 2 diabetes mellitus with other specified complication: Secondary | ICD-10-CM

## 2023-10-14 DIAGNOSIS — E1159 Type 2 diabetes mellitus with other circulatory complications: Secondary | ICD-10-CM

## 2023-10-14 DIAGNOSIS — E1140 Type 2 diabetes mellitus with diabetic neuropathy, unspecified: Secondary | ICD-10-CM

## 2023-10-15 ENCOUNTER — Encounter: Admitting: Family Medicine

## 2023-10-18 ENCOUNTER — Other Ambulatory Visit: Payer: Self-pay | Admitting: Family Medicine

## 2023-10-23 ENCOUNTER — Other Ambulatory Visit: Payer: Self-pay | Admitting: Family Medicine

## 2023-10-23 DIAGNOSIS — M62838 Other muscle spasm: Secondary | ICD-10-CM

## 2023-10-29 LAB — OPHTHALMOLOGY REPORT-SCANNED

## 2023-11-20 ENCOUNTER — Other Ambulatory Visit: Payer: Self-pay | Admitting: Family Medicine

## 2023-11-20 DIAGNOSIS — J4540 Moderate persistent asthma, uncomplicated: Secondary | ICD-10-CM

## 2023-11-20 DIAGNOSIS — E1140 Type 2 diabetes mellitus with diabetic neuropathy, unspecified: Secondary | ICD-10-CM

## 2023-11-20 DIAGNOSIS — K219 Gastro-esophageal reflux disease without esophagitis: Secondary | ICD-10-CM

## 2023-11-26 ENCOUNTER — Encounter: Payer: Self-pay | Admitting: Family Medicine

## 2023-11-26 ENCOUNTER — Ambulatory Visit (INDEPENDENT_AMBULATORY_CARE_PROVIDER_SITE_OTHER): Admitting: Family Medicine

## 2023-11-26 VITALS — BP 123/77 | HR 87 | Temp 98.0°F | Ht 66.0 in | Wt 188.0 lb

## 2023-11-26 DIAGNOSIS — K219 Gastro-esophageal reflux disease without esophagitis: Secondary | ICD-10-CM | POA: Diagnosis not present

## 2023-11-26 DIAGNOSIS — L84 Corns and callosities: Secondary | ICD-10-CM

## 2023-11-26 DIAGNOSIS — Z7984 Long term (current) use of oral hypoglycemic drugs: Secondary | ICD-10-CM

## 2023-11-26 DIAGNOSIS — I152 Hypertension secondary to endocrine disorders: Secondary | ICD-10-CM | POA: Diagnosis not present

## 2023-11-26 DIAGNOSIS — Z23 Encounter for immunization: Secondary | ICD-10-CM

## 2023-11-26 DIAGNOSIS — Z6830 Body mass index (BMI) 30.0-30.9, adult: Secondary | ICD-10-CM

## 2023-11-26 DIAGNOSIS — E1159 Type 2 diabetes mellitus with other circulatory complications: Secondary | ICD-10-CM | POA: Diagnosis not present

## 2023-11-26 DIAGNOSIS — I252 Old myocardial infarction: Secondary | ICD-10-CM | POA: Diagnosis not present

## 2023-11-26 DIAGNOSIS — E1140 Type 2 diabetes mellitus with diabetic neuropathy, unspecified: Secondary | ICD-10-CM

## 2023-11-26 DIAGNOSIS — E66811 Obesity, class 1: Secondary | ICD-10-CM

## 2023-11-26 DIAGNOSIS — E1169 Type 2 diabetes mellitus with other specified complication: Secondary | ICD-10-CM | POA: Diagnosis not present

## 2023-11-26 DIAGNOSIS — E785 Hyperlipidemia, unspecified: Secondary | ICD-10-CM

## 2023-11-26 DIAGNOSIS — Z1159 Encounter for screening for other viral diseases: Secondary | ICD-10-CM

## 2023-11-26 DIAGNOSIS — E6609 Other obesity due to excess calories: Secondary | ICD-10-CM

## 2023-11-26 LAB — BAYER DCA HB A1C WAIVED: HB A1C (BAYER DCA - WAIVED): 7.1 % — ABNORMAL HIGH (ref 4.8–5.6)

## 2023-11-26 NOTE — Progress Notes (Signed)
 Established Patient Office Visit  Subjective   Patient ID: Anthony Swanson, male    DOB: Jun 01, 1950  Age: 73 y.o. MRN: [id number]  Chief Complaint  Patient presents with   Medical Management of Chronic Issues    HPI  Discussed the use of AI scribe software for clinical note transcription with the patient, who gave verbal consent to proceed.  History of Present Illness   Anthony Swanson is a 73 year old male with type 2 diabetes who presents for follow-up of his diabetes management.  Glycemic control - Type 2 diabetes mellitus managed with glipizide  twice daily, Jardiance , and Rybelsus  - Recent hemoglobin A1c is 7.1% - Morning blood glucose levels range from 118 to 122 mg/dL - He has gained some weight since his last visit.  - He has not been as active as before.   Mobility and functional status - Currently resides in a home with 14 steps, which limits activity - Preparing to move to a new apartment with no stairs, anticipating increased activity  Dyspnea on exertion - Experiences shortness of breath with exertion. This has been stable for him - No chest pain, edema, or dizziness  Foot care, neuropathy  - Presence of calluses on feet - Would like new Rx for diabetic shoes  Safety concerns - Interested in a life alert system for safety due to current living situation        ROS As per HPI.    Objective:     BP 123/77   Pulse 87   Temp 98 F (36.7 C) (Temporal)   Ht 5' 6 (1.676 m)   Wt 188 lb (85.3 kg)   SpO2 94%   BMI 30.34 kg/m  Wt Readings from Last 3 Encounters:  11/26/23 188 lb (85.3 kg)  08/25/23 182 lb 3.2 oz (82.6 kg)  05/14/23 181 lb (82.1 kg)      Physical Exam Vitals and nursing note reviewed.  Constitutional:      General: He is not in acute distress.    Appearance: He is not ill-appearing, toxic-appearing or diaphoretic.  HENT:     Mouth/Throat:     Mouth: Mucous membranes are moist.     Pharynx: Oropharynx is clear.  Cardiovascular:      Rate and Rhythm: Normal rate and regular rhythm.     Heart sounds: Normal heart sounds. No murmur heard. Pulmonary:     Effort: Pulmonary effort is normal. No respiratory distress.     Breath sounds: Normal breath sounds.  Abdominal:     General: Bowel sounds are normal. There is no distension.     Palpations: Abdomen is soft.     Tenderness: There is no abdominal tenderness. There is no guarding or rebound.  Musculoskeletal:     Right lower leg: No edema.     Left lower leg: No edema.  Skin:    General: Skin is warm and dry.  Neurological:     General: No focal deficit present.     Mental Status: He is alert and oriented to person, place, and time.  Psychiatric:        Mood and Affect: Mood normal.        Behavior: Behavior normal.     Diabetic Foot Exam - Simple   Simple Foot Form Diabetic Foot exam was performed with the following findings: Yes 11/26/2023  9:55 AM  Visual Inspection Sensation Testing Intact to touch and monofilament testing bilaterally: Yes Pulse Check Posterior Tibialis and Dorsalis pulse  intact bilaterally: Yes Comments Calluses present bilaterally.      No results found for any visits on 11/26/23.    The ASCVD Risk score (Arnett DK, et al., 2019) failed to calculate for the following reasons:   Risk score cannot be calculated because patient has a medical history suggesting prior/existing ASCVD    Assessment & Plan:   Anthony Swanson was seen today for medical management of chronic issues.  Diagnoses and all orders for this visit:  Type 2 diabetes mellitus with diabetic neuropathy, without long-term current use of insulin (HCC) -     Bayer DCA Hb A1c Waived -     Cancel: For Home Use Only DME Diabetic Shoe -     For Home Use Only DME Diabetic Shoe  Foot callus -     Cancel: For Home Use Only DME Diabetic Shoe -     For Home Use Only DME Diabetic Shoe  Hyperlipidemia associated with type 2 diabetes mellitus (HCC)  Hypertension associated  with diabetes (HCC) -     CBC with Differential/Platelet -     CMP14+EGFR  Class 1 obesity due to excess calories with serious comorbidity and body mass index (BMI) of 30.0 to 30.9 in adult -     TSH  History of myocardial infarction  Gastroesophageal reflux disease without esophagitis  Encounter for hepatitis C screening test for low risk patient -     HCV Ab w Reflex to Quant PCR  Encounter for immunization -     Flu vaccine HIGH DOSE PF(Fluzone Trivalent)   Assessment and Plan    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 Morning glucose levels within target. Continuous glucose monitoring discussed but not covered by insurance. Decision to monitor A1c for three more months before considering medication adjustments. - Continue glipizide , Jardiance , Rybelsus . - Monitor A1c in three months. - Encourage increased physical activity after moving to new residence. - Rx for diabetic shoes for foot calluses.   HLD On statin.   HTN BP at goal.   Obesity Weight gain of six pounds since last visit. Physical activity limited due to current living situation with stairs, expected to improve after moving to a new residence with no stairs. - Encourage increased physical activity after moving to new residence. - Monitor weight and dietary intake.  Hx of MI On statin.  Gastroesophageal reflux disease Condition well-controlled.  General Health Maintenance Discussed potential use of life alert system for safety, but current cost is prohibitive. - Inquire about life alert system options with Anthony Swanson.       Return in about 3 months (around 02/25/2024) for chronic follow up.    Anthony CHRISTELLA Search, FNP

## 2023-11-27 ENCOUNTER — Ambulatory Visit: Payer: Self-pay | Admitting: Family Medicine

## 2023-11-27 LAB — CMP14+EGFR
ALT: 44 IU/L (ref 0–44)
AST: 32 IU/L (ref 0–40)
Albumin: 4.8 g/dL (ref 3.8–4.8)
Alkaline Phosphatase: 58 IU/L (ref 47–123)
BUN/Creatinine Ratio: 12 (ref 10–24)
BUN: 13 mg/dL (ref 8–27)
Bilirubin Total: 0.5 mg/dL (ref 0.0–1.2)
CO2: 22 mmol/L (ref 20–29)
Calcium: 9.9 mg/dL (ref 8.6–10.2)
Chloride: 97 mmol/L (ref 96–106)
Creatinine, Ser: 1.07 mg/dL (ref 0.76–1.27)
Globulin, Total: 2.2 g/dL (ref 1.5–4.5)
Glucose: 140 mg/dL — ABNORMAL HIGH (ref 70–99)
Potassium: 4 mmol/L (ref 3.5–5.2)
Sodium: 136 mmol/L (ref 134–144)
Total Protein: 7 g/dL (ref 6.0–8.5)
eGFR: 73 mL/min/1.73 (ref >59)

## 2023-11-27 LAB — CBC WITH DIFFERENTIAL/PLATELET
Basophils Absolute: 0.1 x10E3/uL (ref 0.0–0.2)
Basos: 1 %
EOS (ABSOLUTE): 0.3 x10E3/uL (ref 0.0–0.4)
Eos: 3 %
Hematocrit: 51.7 % — ABNORMAL HIGH (ref 37.5–51.0)
Hemoglobin: 17 g/dL (ref 13.0–17.7)
Immature Grans (Abs): 0.1 x10E3/uL (ref 0.0–0.1)
Immature Granulocytes: 1 %
Lymphocytes Absolute: 2.4 x10E3/uL (ref 0.7–3.1)
Lymphs: 26 %
MCH: 29.6 pg (ref 26.6–33.0)
MCHC: 32.9 g/dL (ref 31.5–35.7)
MCV: 90 fL (ref 79–97)
Monocytes Absolute: 0.7 x10E3/uL (ref 0.1–0.9)
Monocytes: 8 %
Neutrophils Absolute: 5.7 x10E3/uL (ref 1.4–7.0)
Neutrophils: 61 %
Platelets: 239 x10E3/uL (ref 150–450)
RBC: 5.74 x10E6/uL (ref 4.14–5.80)
RDW: 13 % (ref 11.6–15.4)
WBC: 9.3 x10E3/uL (ref 3.4–10.8)

## 2023-11-27 LAB — HCV AB W REFLEX TO QUANT PCR: HCV Ab: NONREACTIVE

## 2023-11-27 LAB — TSH: TSH: 2.03 u[IU]/mL (ref 0.450–4.500)

## 2023-11-27 LAB — HCV INTERPRETATION

## 2023-12-01 ENCOUNTER — Other Ambulatory Visit: Payer: Self-pay

## 2023-12-01 DIAGNOSIS — I70213 Atherosclerosis of native arteries of extremities with intermittent claudication, bilateral legs: Secondary | ICD-10-CM

## 2023-12-05 ENCOUNTER — Other Ambulatory Visit: Payer: Self-pay | Admitting: Family Medicine

## 2023-12-05 DIAGNOSIS — J4540 Moderate persistent asthma, uncomplicated: Secondary | ICD-10-CM

## 2023-12-10 ENCOUNTER — Other Ambulatory Visit: Payer: Self-pay | Admitting: Family Medicine

## 2023-12-10 DIAGNOSIS — E1140 Type 2 diabetes mellitus with diabetic neuropathy, unspecified: Secondary | ICD-10-CM

## 2023-12-10 DIAGNOSIS — E1169 Type 2 diabetes mellitus with other specified complication: Secondary | ICD-10-CM

## 2023-12-10 DIAGNOSIS — I152 Hypertension secondary to endocrine disorders: Secondary | ICD-10-CM

## 2023-12-19 ENCOUNTER — Telehealth: Payer: Self-pay | Admitting: Family Medicine

## 2023-12-19 NOTE — Telephone Encounter (Signed)
 Copied from CRM [id number]. Topic: Clinical - Medical Advice >> Dec 19, 2023 12:03 PM Merlynn A wrote: Reason for CRM: Patient called in requesting to speak with [facility] Dr. Dennie nurse. Please contact patient back at [phone].

## 2023-12-19 NOTE — Telephone Encounter (Signed)
 I spoke to pt and he was asking if we had another number for [facility] pharmacy because he left a message about diabetic shoes but they never called and they have the DME order. Advised pt to go to the pharmacy and ask them face to face and if he needs any further assistance to call me back and pt voiced understanding.

## 2023-12-22 ENCOUNTER — Other Ambulatory Visit: Payer: Self-pay | Admitting: Family Medicine

## 2023-12-22 DIAGNOSIS — M62838 Other muscle spasm: Secondary | ICD-10-CM

## 2023-12-23 ENCOUNTER — Encounter: Admitting: Vascular Surgery

## 2023-12-23 ENCOUNTER — Encounter

## 2023-12-23 NOTE — Telephone Encounter (Unsigned)
 Copied from CRM [id number]. Topic: Clinical - Order For Equipment >> Dec 23, 2023 10:23 AM Myrick T wrote: Reason for CRM: patient called stated he has called [facility] several times and has been hung up on. He is tired of call and need Dorthea or Annabella Search to send in another script for his diabetic shoes to [facility] at [employer] . Please f/u with patient.

## 2023-12-23 NOTE — Telephone Encounter (Signed)
 Order reprinted and placed on PCP's desk for signature

## 2023-12-26 NOTE — Telephone Encounter (Signed)
 Order faxed to [facility] pharmacy on 12/25/23

## 2023-12-29 DIAGNOSIS — E1140 Type 2 diabetes mellitus with diabetic neuropathy, unspecified: Secondary | ICD-10-CM | POA: Diagnosis not present

## 2024-01-27 ENCOUNTER — Telehealth: Payer: Self-pay | Admitting: Family Medicine

## 2024-01-27 NOTE — Telephone Encounter (Unsigned)
 Copied from CRM #[id number]. Topic: Clinical - Medical Advice >> Jan 27, 2024  4:40 PM Debby BROCKS wrote: Reason for CRM: Patient would like to be recommended to a good ear specialist that isn't in [place].

## 2024-01-28 NOTE — Telephone Encounter (Signed)
 [facility] in [place] [phone] contact information given to pt to call.

## 2024-01-29 ENCOUNTER — Other Ambulatory Visit: Payer: Self-pay | Admitting: Family Medicine

## 2024-01-29 DIAGNOSIS — J4540 Moderate persistent asthma, uncomplicated: Secondary | ICD-10-CM

## 2024-02-10 ENCOUNTER — Encounter: Admitting: Vascular Surgery

## 2024-02-10 ENCOUNTER — Other Ambulatory Visit: Payer: Self-pay | Admitting: Family Medicine

## 2024-02-10 ENCOUNTER — Encounter

## 2024-02-10 DIAGNOSIS — E1140 Type 2 diabetes mellitus with diabetic neuropathy, unspecified: Secondary | ICD-10-CM

## 2024-02-18 ENCOUNTER — Other Ambulatory Visit: Payer: Self-pay | Admitting: Family Medicine

## 2024-02-18 DIAGNOSIS — E1140 Type 2 diabetes mellitus with diabetic neuropathy, unspecified: Secondary | ICD-10-CM

## 2024-02-18 DIAGNOSIS — M62838 Other muscle spasm: Secondary | ICD-10-CM

## 2024-02-18 DIAGNOSIS — K219 Gastro-esophageal reflux disease without esophagitis: Secondary | ICD-10-CM

## 2024-02-18 DIAGNOSIS — E1169 Type 2 diabetes mellitus with other specified complication: Secondary | ICD-10-CM

## 2024-02-18 DIAGNOSIS — I152 Hypertension secondary to endocrine disorders: Secondary | ICD-10-CM

## 2024-02-26 ENCOUNTER — Encounter: Payer: Self-pay | Admitting: Family Medicine

## 2024-02-26 ENCOUNTER — Ambulatory Visit (INDEPENDENT_AMBULATORY_CARE_PROVIDER_SITE_OTHER): Payer: Self-pay | Admitting: Family Medicine

## 2024-02-26 ENCOUNTER — Ambulatory Visit

## 2024-02-26 VITALS — BP 134/71 | HR 95 | Temp 98.4°F | Ht 66.0 in | Wt 187.2 lb

## 2024-02-26 DIAGNOSIS — E1140 Type 2 diabetes mellitus with diabetic neuropathy, unspecified: Secondary | ICD-10-CM | POA: Diagnosis not present

## 2024-02-26 DIAGNOSIS — E1159 Type 2 diabetes mellitus with other circulatory complications: Secondary | ICD-10-CM

## 2024-02-26 DIAGNOSIS — E1169 Type 2 diabetes mellitus with other specified complication: Secondary | ICD-10-CM | POA: Diagnosis not present

## 2024-02-26 DIAGNOSIS — M546 Pain in thoracic spine: Secondary | ICD-10-CM

## 2024-02-26 DIAGNOSIS — M545 Low back pain, unspecified: Secondary | ICD-10-CM

## 2024-02-26 DIAGNOSIS — E785 Hyperlipidemia, unspecified: Secondary | ICD-10-CM | POA: Diagnosis not present

## 2024-02-26 DIAGNOSIS — Z7984 Long term (current) use of oral hypoglycemic drugs: Secondary | ICD-10-CM | POA: Diagnosis not present

## 2024-02-26 DIAGNOSIS — E66811 Obesity, class 1: Secondary | ICD-10-CM | POA: Diagnosis not present

## 2024-02-26 DIAGNOSIS — E6609 Other obesity due to excess calories: Secondary | ICD-10-CM

## 2024-02-26 DIAGNOSIS — K219 Gastro-esophageal reflux disease without esophagitis: Secondary | ICD-10-CM | POA: Diagnosis not present

## 2024-02-26 DIAGNOSIS — I152 Hypertension secondary to endocrine disorders: Secondary | ICD-10-CM

## 2024-02-26 DIAGNOSIS — Z6830 Body mass index (BMI) 30.0-30.9, adult: Secondary | ICD-10-CM | POA: Diagnosis not present

## 2024-02-26 DIAGNOSIS — G8929 Other chronic pain: Secondary | ICD-10-CM

## 2024-02-26 LAB — BAYER DCA HB A1C WAIVED: HB A1C (BAYER DCA - WAIVED): 7.1 % — ABNORMAL HIGH (ref 4.8–5.6)

## 2024-02-26 MED ORDER — LIDOCAINE-PRILOCAINE 2.5-2.5 % EX CREA: 1.0000 | TOPICAL_CREAM | CUTANEOUS | 0 refills | Status: AC | PRN

## 2024-02-26 NOTE — Progress Notes (Signed)
 Established Patient Office Visit  Subjective   Patient ID: Anthony Swanson, male    DOB: 08-26-1950  Age: 73 y.o. MRN: [id number]  Chief Complaint  Patient presents with   Medical Management of Chronic Issues    HPI  History of Present Illness   Anthony Swanson is a 73 year old male with type 2 diabetes who presents for follow-up of his diabetes management and back pain after a fall. He is accompanied by a family member, possibly his daughter, who is concerned about his health and safety.  Glycemic control in type 2 diabetes - A1c remains at 7.1, slightly above target. - Continues treatment with Jardiance , Rybelsus , and glipizide . - Limited ambulation due to cold weather. - Regular blood glucose monitoring with stable readings. - Dietary intake was increased over Thanksgiving.  Back pain following fall - Fall occurred approximately two weeks ago after Thanksgiving during first snow. - Slipped in breezeway of new apartment treated with ice melt. - Sustained a bruise on the back, approximately the size of a golf ball. This has decreased significantly - Persistent back pain since the fall. - Muscle relaxant and lidocaine  patches have not provided relief. - No chest pain, shortness of breath, or numbness or tingling across the back. - No changes in bowel or bladder control  Gastroesophageal reflux disease - Acid reflux managed with medication, which is effective.  Fall risk and safety concerns - Expresses concern regarding safety and risk of future falls. - Discusses potential use of life alert system. - Lack of internet access limits emergency communication options.        ROS As per HPI.    Objective:     BP 134/71   Pulse 95   Temp 98.4 F (36.9 C) (Temporal)   Ht 5' 6 (1.676 m)   Wt 187 lb 3.2 oz (84.9 kg)   SpO2 95%   BMI 30.21 kg/m  Wt Readings from Last 3 Encounters:  02/26/24 187 lb 3.2 oz (84.9 kg)  11/26/23 188 lb (85.3 kg)  08/25/23 182 lb 3.2 oz  (82.6 kg)      Physical Exam Vitals reviewed.  Constitutional:      General: He is not in acute distress.    Appearance: He is not ill-appearing, toxic-appearing or diaphoretic.  Cardiovascular:     Rate and Rhythm: Normal rate and regular rhythm.     Heart sounds: Normal heart sounds. No murmur heard. Pulmonary:     Effort: Pulmonary effort is normal. No respiratory distress.     Breath sounds: Normal breath sounds. No wheezing, rhonchi or rales.  Musculoskeletal:     Thoracic back: Swelling (mild midline) and tenderness (midline) present. No edema, deformity, signs of trauma, lacerations, spasms or bony tenderness. Normal range of motion. No scoliosis.     Right lower leg: No edema.     Left lower leg: No edema.  Skin:    General: Skin is warm and dry.  Neurological:     Mental Status: He is alert.      No results found for any visits on 02/26/24.    The ASCVD Risk score (Arnett DK, et al., 2019) failed to calculate for the following reasons:   Risk score cannot be calculated because patient has a medical history suggesting prior/existing ASCVD   * - Cholesterol units were assumed    Assessment & Plan:   Anthony Swanson was seen today for medical management of chronic issues.  Diagnoses and all orders for this  visit:  Type 2 diabetes mellitus with diabetic neuropathy, without long-term current use of insulin (HCC) -     Bayer DCA Hb A1c Waived  Hypertension associated with diabetes (HCC)  Hyperlipidemia associated with type 2 diabetes mellitus (HCC)  Gastroesophageal reflux disease without esophagitis  Class 1 obesity due to excess calories with serious comorbidity and body mass index (BMI) of 30.0 to 30.9 in adult  Chronic midline low back pain without sciatica -     lidocaine -prilocaine  (EMLA ) cream; Apply 1 Application topically as needed.  Acute midline thoracic back pain -     DG Thoracic Spine 2 View   Assessment and Plan    Acute thoracic pain after  fall Persistent pain post-fall, current treatment inadequate. - Ordered thoracic spine x-ray to assess for possible fracture - Prescribed lidocaine  and prilocaine  cream.  Chronic low back pain - Prescribed lidocaine  and prilocaine  cream.   Type 2 diabetes mellitus with diabetic neuropathy A1c at 7.1, slightly above target. Insurance does not cover CGM. - Continue Jardiance , Rybelsus , and glipizide . - Declines changes in regimen today.  - Reassess A1c in three months.  Gastroesophageal reflux disease Well controlled. Continue current GERD medication.  Obesity Weight down 1 lb. Diet, exercise, weight loss.   HTN with diabetes BP at goal.   HLD with diabetes On statin.      Return in about 3 months (around 05/26/2024) for chronic follow up.   The patient indicates understanding of these issues and agrees with the plan.  Anthony CHRISTELLA Search, FNP

## 2024-03-02 ENCOUNTER — Telehealth: Payer: Self-pay

## 2024-03-02 ENCOUNTER — Telehealth: Payer: Self-pay | Admitting: Family Medicine

## 2024-03-02 NOTE — Telephone Encounter (Signed)
 Copied from CRM #[id number]. Topic: Clinical - Request for Lab/Test Order >> Mar 02, 2024  4:01 PM Myrick T wrote: Reason for CRM: Ela from [facility] called stated provider would need to contact [facility] at [phone] to place the order for his diabetic shoes.

## 2024-03-02 NOTE — Telephone Encounter (Signed)
 Copied from CRM [id number]. Topic: Clinical - Lab/Test Results >> Mar 02, 2024  2:37 PM Zebedee SAUNDERS wrote: Reason for CRM: Pt calling for imaging results for [facility] Thoracic Spine 2 View (Accession [id number]) (Order [id number]). Please call pt at [phone].

## 2024-03-02 NOTE — Telephone Encounter (Signed)
 Results not back yet will call once it comes back.

## 2024-03-15 ENCOUNTER — Ambulatory Visit: Payer: Self-pay | Admitting: Family Medicine

## 2024-03-15 ENCOUNTER — Other Ambulatory Visit: Payer: Self-pay | Admitting: Family Medicine

## 2024-03-15 DIAGNOSIS — J4540 Moderate persistent asthma, uncomplicated: Secondary | ICD-10-CM

## 2024-03-19 ENCOUNTER — Other Ambulatory Visit: Payer: Self-pay | Admitting: Family Medicine

## 2024-03-19 DIAGNOSIS — E1140 Type 2 diabetes mellitus with diabetic neuropathy, unspecified: Secondary | ICD-10-CM

## 2024-03-19 DIAGNOSIS — K219 Gastro-esophageal reflux disease without esophagitis: Secondary | ICD-10-CM

## 2024-03-25 ENCOUNTER — Other Ambulatory Visit: Payer: Self-pay | Admitting: Family Medicine

## 2024-03-25 DIAGNOSIS — E1169 Type 2 diabetes mellitus with other specified complication: Secondary | ICD-10-CM

## 2024-03-25 DIAGNOSIS — E1159 Type 2 diabetes mellitus with other circulatory complications: Secondary | ICD-10-CM

## 2024-03-25 DIAGNOSIS — E1140 Type 2 diabetes mellitus with diabetic neuropathy, unspecified: Secondary | ICD-10-CM

## 2024-03-25 DIAGNOSIS — K219 Gastro-esophageal reflux disease without esophagitis: Secondary | ICD-10-CM

## 2024-03-25 NOTE — Telephone Encounter (Unsigned)
 Copied from CRM [id number]. Topic: Clinical - Medication Refill >> Mar 25, 2024  2:19 PM Joesph B wrote: Medication: PATIENT WANTS A 90 DAY REFILL   glipiZIDE  (GLUCOTROL ) 10 MG tablet [[id number]] losartan  (COZAAR ) 50 MG tablet [[id number]] hydrochlorothiazide  (HYDRODIURIL ) 12.5 MG tablet [[id number]] omeprazole  (PRILOSEC) 40 MG capsule [[id number]] simvastatin  (ZOCOR ) 20 MG tablet  [[id number]] empagliflozin  (JARDIANCE ) 25 MG TABS tablet [[id number]]   Has the patient contacted their pharmacy? Yes (Agent: If no, request that the patient contact the pharmacy for the refill. If patient does not wish to contact the pharmacy document the reason why and proceed with request.) (Agent: If yes, when and what did the pharmacy advise?)  This is the patient's preferred pharmacy:  [facility] - [place], [place] - [address] [place] [address] Phone: [phone] Fax: [phone]   Is this the correct pharmacy for this prescription? Yes If no, delete pharmacy and type the correct one.   Has the prescription been filled recently? Yes  Is the patient out of the medication? Yes  Has the patient been seen for an appointment in the last year OR does the patient have an upcoming appointment? Yes  Can we respond through MyChart? Yes  Agent: Please be advised that Rx refills may take up to 3 business days. We ask that you follow-up with your pharmacy.

## 2024-03-29 ENCOUNTER — Telehealth: Payer: Self-pay | Admitting: Family Medicine

## 2024-03-29 DIAGNOSIS — I152 Hypertension secondary to endocrine disorders: Secondary | ICD-10-CM

## 2024-03-29 DIAGNOSIS — E1169 Type 2 diabetes mellitus with other specified complication: Secondary | ICD-10-CM

## 2024-03-29 DIAGNOSIS — E1140 Type 2 diabetes mellitus with diabetic neuropathy, unspecified: Secondary | ICD-10-CM

## 2024-03-29 DIAGNOSIS — J4540 Moderate persistent asthma, uncomplicated: Secondary | ICD-10-CM

## 2024-03-29 DIAGNOSIS — K219 Gastro-esophageal reflux disease without esophagitis: Secondary | ICD-10-CM

## 2024-03-29 NOTE — Telephone Encounter (Unsigned)
 Copied from CRM [id number]. Topic: Clinical - Prescription Issue >> Mar 29, 2024  3:47 PM Kevelyn M wrote: Reason for CRM: Patient is requesting  for the following medications to be a 90 day supply. It's too expensive when it's a 30 day supply. Famotidine  (PEPCID ) 20 MG tablet, glipiZIDE  (GLUCOTROL ) 10 MG tablet, albuterol  (VENTOLIN  HFA) 108 (90 Base) MCG/ACT inhaler, empagliflozin  (JARDIANCE ) 25 MG TABS tablet, hydrochlorothiazide  (HYDRODIURIL ) 12.5 MG, losartan  (COZAAR ) 50 MG tablet, omeprazole  (PRILOSEC) 40 MG capsule, Semaglutide  (RYBELSUS ) 14 MG TABS, simvastatin  (ZOCOR ) 20 MG tablet.

## 2024-04-05 NOTE — Telephone Encounter (Unsigned)
 Copied from CRM [id number]. Topic: Clinical - Prescription Issue >> Mar 29, 2024  3:47 PM Kevelyn M wrote: Reason for CRM: Patient is requesting  for the following medications to be a 90 day supply. It's too expensive when it's a 30 day supply. Famotidine  (PEPCID ) 20 MG tablet, glipiZIDE  (GLUCOTROL ) 10 MG tablet, albuterol  (VENTOLIN  HFA) 108 (90 Base) MCG/ACT inhaler, empagliflozin  (JARDIANCE ) 25 MG TABS tablet, hydrochlorothiazide  (HYDRODIURIL ) 12.5 MG, losartan  (COZAAR ) 50 MG tablet, omeprazole  (PRILOSEC) 40 MG capsule, Semaglutide  (RYBELSUS ) 14 MG TABS, simvastatin  (ZOCOR ) 20 MG tablet. >> Apr 05, 2024  9:23 AM Ivette P wrote: PT called in regarding this request. PT would like a follow up call when clinic is open so he knows he will get prescription in February.    Callback [phone]

## 2024-04-06 MED ORDER — LOSARTAN POTASSIUM 50 MG PO TABS: 50.0000 mg | ORAL_TABLET | Freq: Every day | ORAL | 0 refills | Status: AC

## 2024-04-06 MED ORDER — RYBELSUS 14 MG PO TABS: 1.0000 | ORAL_TABLET | Freq: Every day | ORAL | 0 refills | Status: AC

## 2024-04-06 MED ORDER — OMEPRAZOLE 40 MG PO CPDR: 40.0000 mg | DELAYED_RELEASE_CAPSULE | Freq: Every morning | ORAL | 0 refills | Status: AC

## 2024-04-06 MED ORDER — HYDROCHLOROTHIAZIDE 12.5 MG PO TABS: 12.5000 mg | ORAL_TABLET | Freq: Every day | ORAL | 0 refills | Status: AC

## 2024-04-06 MED ORDER — FAMOTIDINE 20 MG PO TABS: ORAL_TABLET | ORAL | 0 refills | Status: AC

## 2024-04-06 MED ORDER — ALBUTEROL SULFATE HFA 108 (90 BASE) MCG/ACT IN AERS: INHALATION_SPRAY | RESPIRATORY_TRACT | 0 refills | Status: AC

## 2024-04-06 MED ORDER — EMPAGLIFLOZIN 25 MG PO TABS: 25.0000 mg | ORAL_TABLET | Freq: Every day | ORAL | 0 refills | Status: AC

## 2024-04-06 MED ORDER — SIMVASTATIN 20 MG PO TABS: 20.0000 mg | ORAL_TABLET | Freq: Every morning | ORAL | 0 refills | Status: AC

## 2024-04-06 MED ORDER — GLIPIZIDE 10 MG PO TABS: 10.0000 mg | ORAL_TABLET | Freq: Two times a day (BID) | ORAL | 0 refills | Status: AC

## 2024-04-06 NOTE — Addendum Note (Signed)
 Addended by: INA RAMP D on: 04/06/2024 04:22 PM   Modules accepted: Orders

## 2024-04-06 NOTE — Telephone Encounter (Signed)
 Spoke w/ pt, these medications were sent in in Dec & Jan for 90-d supply. He said that he just picked some up but they were only for a 30-d supply. He can pay the same price for a 90-d supply as a 30-d supply. Spoke to [facility] and let them know this as well and resent all new scripts to them.

## 2024-05-04 ENCOUNTER — Ambulatory Visit: Payer: Self-pay

## 2024-06-02 ENCOUNTER — Ambulatory Visit: Admitting: Family Medicine
